# Patient Record
Sex: Female | Born: 1950 | Race: White | Hispanic: Refuse to answer | Marital: Married | Smoking: Former smoker
Health system: Southern US, Community
[De-identification: ages and names within clinical notes are randomized; demographics above are authoritative.]

## PROBLEM LIST (undated history)

## (undated) DIAGNOSIS — J301 Allergic rhinitis due to pollen: Secondary | ICD-10-CM

## (undated) DIAGNOSIS — F419 Anxiety disorder, unspecified: Secondary | ICD-10-CM

## (undated) DIAGNOSIS — I447 Left bundle-branch block, unspecified: Secondary | ICD-10-CM

## (undated) DIAGNOSIS — E039 Hypothyroidism, unspecified: Secondary | ICD-10-CM

## (undated) DIAGNOSIS — E7800 Pure hypercholesterolemia, unspecified: Secondary | ICD-10-CM

## (undated) DIAGNOSIS — J45909 Unspecified asthma, uncomplicated: Secondary | ICD-10-CM

## (undated) DIAGNOSIS — M1990 Unspecified osteoarthritis, unspecified site: Secondary | ICD-10-CM

## (undated) DIAGNOSIS — R310 Gross hematuria: Secondary | ICD-10-CM

## (undated) DIAGNOSIS — L409 Psoriasis, unspecified: Secondary | ICD-10-CM

## (undated) HISTORY — DX: Anxiety disorder, unspecified: F41.9

## (undated) HISTORY — DX: Hypothyroidism, unspecified: E03.9

## (undated) HISTORY — DX: Left bundle-branch block, unspecified: I44.7

## (undated) HISTORY — DX: Gross hematuria: R31.0

## (undated) HISTORY — DX: Allergic rhinitis due to pollen: J30.1

## (undated) HISTORY — DX: Pure hypercholesterolemia, unspecified: E78.00

## (undated) HISTORY — DX: Unspecified osteoarthritis, unspecified site: M19.90

## (undated) HISTORY — DX: Psoriasis, unspecified: L40.9

## (undated) HISTORY — DX: Unspecified asthma, uncomplicated: J45.909

---

## 2008-09-22 ENCOUNTER — Encounter: Payer: Self-pay | Admitting: Family Medicine

## 2008-09-26 ENCOUNTER — Encounter: Payer: Self-pay | Admitting: Family Medicine

## 2009-03-08 ENCOUNTER — Encounter: Payer: Self-pay | Admitting: Family Medicine

## 2009-03-08 LAB — CONVERTED CEMR LAB: TSH: 6.9 microintl units/mL

## 2009-08-01 ENCOUNTER — Ambulatory Visit: Payer: Self-pay | Admitting: Family Medicine

## 2009-08-01 DIAGNOSIS — E785 Hyperlipidemia, unspecified: Secondary | ICD-10-CM

## 2009-08-01 DIAGNOSIS — I10 Essential (primary) hypertension: Secondary | ICD-10-CM

## 2009-08-01 DIAGNOSIS — L538 Other specified erythematous conditions: Secondary | ICD-10-CM

## 2009-08-01 DIAGNOSIS — E039 Hypothyroidism, unspecified: Secondary | ICD-10-CM | POA: Insufficient documentation

## 2009-08-03 LAB — CONVERTED CEMR LAB
ALT: 22 units/L (ref 0–35)
Albumin: 4.4 g/dL (ref 3.5–5.2)
CO2: 22 meq/L (ref 19–32)
CRP, High Sensitivity: 15.3 — ABNORMAL HIGH
Chloride: 106 meq/L (ref 96–112)
GGT: 24 units/L (ref 7–51)
HDL: 45 mg/dL (ref >39)
LDL Cholesterol: 125 mg/dL — ABNORMAL HIGH (ref 0–99)
Platelets: 300 10*3/uL (ref 150–400)
Potassium: 4.5 meq/L (ref 3.5–5.3)
Sodium: 141 meq/L (ref 135–145)
Total CHOL/HDL Ratio: 4.6
Total Protein: 7.5 g/dL (ref 6.0–8.3)
Triglycerides: 175 mg/dL — ABNORMAL HIGH (ref <150)
VLDL: 35 mg/dL (ref 0–40)
WBC: 9.2 10*3/uL (ref 4.0–10.5)

## 2009-08-24 ENCOUNTER — Encounter: Payer: Self-pay | Admitting: Family Medicine

## 2009-08-24 DIAGNOSIS — M26629 Arthralgia of temporomandibular joint, unspecified side: Secondary | ICD-10-CM

## 2009-08-24 DIAGNOSIS — E663 Overweight: Secondary | ICD-10-CM | POA: Insufficient documentation

## 2009-08-24 DIAGNOSIS — J45909 Unspecified asthma, uncomplicated: Secondary | ICD-10-CM | POA: Insufficient documentation

## 2009-09-11 ENCOUNTER — Telehealth: Payer: Self-pay | Admitting: Family Medicine

## 2009-09-17 ENCOUNTER — Encounter: Payer: Self-pay | Admitting: Family Medicine

## 2009-09-21 ENCOUNTER — Other Ambulatory Visit: Admission: RE | Admit: 2009-09-21 | Discharge: 2009-09-21 | Payer: Self-pay | Admitting: Family Medicine

## 2009-09-21 ENCOUNTER — Ambulatory Visit: Payer: Self-pay | Admitting: Family Medicine

## 2009-09-21 DIAGNOSIS — Z780 Asymptomatic menopausal state: Secondary | ICD-10-CM | POA: Insufficient documentation

## 2009-09-21 LAB — CONVERTED CEMR LAB
Glucose, Urine, Semiquant: NEGATIVE
Ketones, urine, test strip: NEGATIVE
Specific Gravity, Urine: 1.025
WBC Urine, dipstick: NEGATIVE
pH: 5.5

## 2009-09-27 ENCOUNTER — Telehealth: Payer: Self-pay | Admitting: Family Medicine

## 2009-09-30 ENCOUNTER — Encounter: Payer: Self-pay | Admitting: Family Medicine

## 2009-10-02 LAB — CONVERTED CEMR LAB
CRP: 2 mg/dL — ABNORMAL HIGH (ref <0.6)
TSH: 0.037 microintl units/mL — ABNORMAL LOW (ref 0.350–4.500)

## 2009-10-03 ENCOUNTER — Encounter: Payer: Self-pay | Admitting: Family Medicine

## 2009-10-03 ENCOUNTER — Encounter: Admission: RE | Admit: 2009-10-03 | Discharge: 2009-10-03 | Payer: Self-pay | Admitting: Family Medicine

## 2009-11-16 ENCOUNTER — Ambulatory Visit: Payer: Self-pay | Admitting: Family Medicine

## 2009-11-16 DIAGNOSIS — R319 Hematuria, unspecified: Secondary | ICD-10-CM

## 2009-11-17 LAB — CONVERTED CEMR LAB
ALT: 16 units/L (ref 0–35)
Albumin: 4.4 g/dL (ref 3.5–5.2)
Bilirubin Urine: NEGATIVE
CO2: 25 meq/L (ref 19–32)
CRP: 1.5 mg/dL — ABNORMAL HIGH (ref <0.6)
Cholesterol: 214 mg/dL — ABNORMAL HIGH (ref 0–200)
Ketones, ur: NEGATIVE mg/dL
LDL Cholesterol: 129 mg/dL — ABNORMAL HIGH (ref 0–99)
Nitrite: NEGATIVE
Potassium: 4.6 meq/L (ref 3.5–5.3)
Sodium: 138 meq/L (ref 135–145)
Total Bilirubin: 0.4 mg/dL (ref 0.3–1.2)
Total Protein: 7.4 g/dL (ref 6.0–8.3)
Urobilinogen, UA: 0.2 (ref 0.0–1.0)
VLDL: 38 mg/dL (ref 0–40)
pH: 5.5 (ref 5.0–8.0)

## 2009-12-13 ENCOUNTER — Telehealth: Payer: Self-pay | Admitting: Family Medicine

## 2009-12-18 ENCOUNTER — Telehealth: Payer: Self-pay | Admitting: Family Medicine

## 2010-01-15 ENCOUNTER — Encounter: Payer: Self-pay | Admitting: Family Medicine

## 2010-07-03 ENCOUNTER — Encounter: Payer: Self-pay | Admitting: Family Medicine

## 2010-10-16 NOTE — Progress Notes (Signed)
Summary: clarify dose  Phone Note Outgoing Call   Summary of Call: Pls confirm with pt her dose of armour thyroid.  She has listed 30 mg (4 tabs daily).  Armour comes in GRAINS not MGs and her insurance will not accept this dosage.  Thanks.   Initial call taken by: Seymour Bars DO,  December 18, 2009 3:40 PM  Follow-up for Phone Call        [facility] for Pt to [facility] w/ correct dosing Follow-up by: Payton Spark CMA,  December 18, 2009 4:20 PM  Additional Follow-up for Phone Call Additional follow up Details #1::        Pt states she is taking 30mg  tabs 4 daily for a total of 120mg  daily. Additional Follow-up by: Payton Spark CMA,  December 19, 2009 11:24 AM    Additional Follow-up for Phone Call Additional follow up Details #2::    Called [place] at [facility]. Armour thyroid 120mg  = 2 grain  Follow-up by: Payton Spark CMA,  December 19, 2009 12:02 PM  New/Updated Medications: ARMOUR THYROID 120 MG TABS (THYROID) 1 tab by mouth once a day Prescriptions: ARMOUR THYROID 120 MG TABS (THYROID) 1 tab by mouth once a day  #90 x 0   Entered and Authorized by:   Seymour Bars DO   Signed by:   Seymour Bars DO on 12/19/2009   Method used:   Printed then faxed to ...       [facility] [address] (retail)       [address]       [place], [place]  [address]       Ph: [phone]       Fax: [phone]   RxID:   [id number]  changed her from 4 (30 mg) tabs to 120 mg 1 tab daily = dose b/c insurance would not cover large quantity.  Seymour Bars, D.O.  Appended Document: clarify dose Pt aware

## 2010-10-16 NOTE — Progress Notes (Signed)
Summary: New insurance needs 90 day supply  Phone Note Refill Request   Refills Requested: Medication #1:  XANAX 0.5 MG TABS 1 tab by mouth two times a day as needed anxiety  Medication #2:  ARMOUR THYROID 30 MG TABS 4 tabs by mouth once daily Pt changed insurance coverage and needs 90 days supply of these meds sent to [facility].   Initial call taken by: Payton Spark CMA,  December 13, 2009 1:54 PM    Prescriptions: ARMOUR THYROID 30 MG TABS (THYROID) 4 tabs by mouth once daily  #360 x 0   Entered and Authorized by:   Seymour Bars DO   Signed by:   Seymour Bars DO on 12/13/2009   Method used:   Printed then faxed to ...       [facility] (mail-order)             ,          Ph: [phone]       Fax: [phone]   RxID:   [id number] ARMOUR THYROID 30 MG TABS (THYROID) 4 tabs by mouth once daily  #90 x 0   Entered and Authorized by:   Seymour Bars DO   Signed by:   Seymour Bars DO on 12/13/2009   Method used:   Printed then faxed to ...       [facility]* (mail-order)             ,          Ph: [phone]       Fax: [phone]   RxID:   [id number] XANAX 0.5 MG TABS (ALPRAZOLAM) 1 tab by mouth two times a day as needed anxiety  #180 x 0   Entered and Authorized by:   Seymour Bars DO   Signed by:   Seymour Bars DO on 12/13/2009   Method used:   Printed then faxed to ...       [facility]* (mail-order)             ,          Ph: [phone]       Fax: [phone]   RxID:   [id number]   Appended Document: New insurance needs 90 day supply faxed to [facility]

## 2010-10-16 NOTE — Letter (Signed)
[facility]   Imported By: Sherian Rein 07/17/2010 10:28:48  _____________________________________________________________________  External Attachment:    Type:   Image     Comment:   External Document

## 2010-10-16 NOTE — Assessment & Plan Note (Signed)
Summary: CPE with pap   Vital Signs:  Patient profile:   60 year old female Menstrual status:  postmenopausal Height:      67 inches Weight:      209 pounds BMI:     32.85 O2 Sat:      98 % on Room air Temp:     98.5 degrees F oral Pulse rate:   106 / minute BP sitting:   137 / 85  (left arm) Cuff size:   large  Vitals Entered By: Payton Spark CMA (September 21, 2009 4:11 PM)  O2 Flow:  Room air CC: CPE w/pap     Menstrual Status postmenopausal   Primary Care Provider:  Seymour Bars DO  CC:  CPE w/pap.  History of Present Illness: 60 yo WF presents for CPE with pap smear.  She is due for her pap smear, mammogram and DEXA scan.  She would like to lost 30 lbs.  She wants to get back to dancing.  She eats very healthy and takes supplements.  She has a sedentary job.  She is married.  Her has been postmenopausal x 5 yrs.  Denies any bleeding but has some vaginal irritation.  Denies problems voiding.  Has declined colon cancer screening.  Labs and tetanus are UTD.    Current Medications (verified): 1)  Xanax 0.5 Mg Tabs (Alprazolam) .Marland Kitchen.. 1 Tab By Mouth Two Times A Day As Needed Anxiety 2)  Armour Thyroid 30 Mg Tabs (Thyroid) .... Take As Directed 3)  Doxepin Hcl 25 Mg Caps (Doxepin Hcl) .... Take 2 Tabs By Mouth Daily 4)  Xenical 120 Mg Caps (Orlistat) .Marland Kitchen.. 1 Capsule By Mouth Three Times A Day Give During or < 1 Hr After Meals Containing Fat  Allergies (verified): 1)  ! Penicillin 2)  Jonne Ply  Past History:  Past Medical History: Reviewed history from 09/17/2009 and no changes required. menopause at 54. Hypothyroidism since 1990; Hashimoto's Obesity Celiac Dz AR asthma anxiety High chol (not on meds)  Past Surgical History: Reviewed history from 08/01/2009 and no changes required. Tonsillectomy  Family History: Reviewed history from 08/01/2009 and no changes required. father died from ETOHism mother alive, colon cancer at 53, high chol, HTN, DM 2 sisters and 1  brother healthy  Social History: Reviewed history from 08/01/2009 and no changes required. 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 Has a daughter, 78, lives with them. Quit smoking in 1985. Denies ETOH. Walks 3 x a wk. Goal wt 170.  Review of Systems       The patient complains of weight gain.  The patient denies anorexia, fever, weight loss, vision loss, decreased hearing, hoarseness, chest pain, syncope, dyspnea on exertion, peripheral edema, prolonged cough, headaches, hemoptysis, abdominal pain, melena, hematochezia, severe indigestion/heartburn, hematuria, incontinence, genital sores, muscle weakness, suspicious skin lesions, transient blindness, difficulty walking, depression, unusual weight change, abnormal bleeding, enlarged lymph nodes, angioedema, breast masses, and testicular masses.    Physical Exam  General:  alert, well-developed, well-nourished, and well-hydrated.   obese Head:  normocephalic and atraumatic.   Eyes:  pupils equal, pupils round, and pupils reactive to light.  wears glasses Ears:  no external deformities.   Nose:  no nasal discharge.   Mouth:  pharynx pink and moist and fair dentition.   Neck:  no masses.  no audible carotid bruits Breasts:  No mass, nodules, thickening, tenderness, bulging, retraction, inflamation, nipple discharge or skin changes noted.   Lungs:  Normal respiratory effort, chest expands symmetrically.  Lungs are clear to auscultation, no crackles or wheezes. Heart:  Normal rate and regular rhythm. S1 and S2 normal without gallop, murmur, click, rub or other extra sounds. Abdomen:  Bowel sounds positive,abdomen soft and non-tender without masses, organomegaly; no AA bruits Genitalia:  normal introitus, no external lesions, no vaginal discharge, mucosa pink and moist, no vaginal or cervical lesions, no friaility or hemorrhage, and vaginal atrophy.  grade I uterine prolapse into the vagina Msk:  no joint swelling.   Pulses:  2+ radial and  pedal pulses Extremities:  trace bilat ankle edema Skin:  color normal and no suspicious lesions.   Cervical Nodes:  No lymphadenopathy noted Psych:  good eye contact, not anxious appearing, and not depressed appearing.     Impression & Recommendations:  Problem # 1:  Gynecological examination-routine (ICD-V72.31) Keeping healthy checklist for women reviewed. BP in pre-HTN range.  BMI 32 c/w class I obesity. Labs updated in November showing fasting glucose of 100, dyslipidemia with a high CRP.  We reduced her dose of thyroid medicaiton. Recommend daily MVI along with Calcium/ D daily. Mammogram and DEXA scheduled. Declined colonoscopy, understands risk of not proceeding with this. Immunizations are UTD. Work on [employer], regular exercise, wt loss. Grade I uterine prolapse, asymptomatic.  Consider referral to gyn for this.  Plan to repeat UA at f/u visit for trace blood present.  Problem # 2:  DYSLIPIDEMIA (ICD-272.4)  Iniitate statin for dyslipidemia with a high CRP.  She will start by cutting Crestor tabs in half.  Will work on lifestyle changes.  Check FLP/ CRP/ LFTS in 3 mos. Her updated medication list for this problem includes:    Crestor 10 Mg Tabs (Rosuvastatin calcium) .Marland Kitchen... 1 tab by mouth qhs  Labs Reviewed: SGOT: 18 (08/01/2009)   SGPT: 22 (08/01/2009)   HDL:45 (08/01/2009)  LDL:125 (08/01/2009)  Chol:205 (08/01/2009)  Trig:175 (08/01/2009)  Problem # 3:  UNSPECIFIED HYPOTHYROIDISM (ICD-244.9)  Needs repeat TSH today after decreasing dose 8 wks ago. Her updated medication list for this problem includes:    Armour Thyroid 30 Mg Tabs (Thyroid) .Marland Kitchen... Take as directed  Orders: T-TSH ([id number])  Complete Medication List: 1)  Xanax 0.5 Mg Tabs (Alprazolam) .Marland Kitchen.. 1 tab by mouth two times a day as needed anxiety 2)  Armour Thyroid 30 Mg Tabs (Thyroid) .... Take as directed 3)  Doxepin Hcl 25 Mg Caps (Doxepin hcl) .... Take 2 tabs by mouth daily 4)  Crestor 10 Mg  Tabs (Rosuvastatin calcium) .Marland Kitchen.. 1 tab by mouth qhs  Other Orders: T-Mammography Bilateral Screening (32440) T-DXA Bone Density/ Appendicular (10272) T-Dual DXA Bone Density/ Axial (53664) UA Dipstick w/o Micro (automated)  (81003)  Patient Instructions: 1)  Start Crestor -- cut 10 mg tabs in half and take at bedtime each night. 2)  Recheck TSH today. 3)  Will call you w/ results tomorrow. 4)  Schedule your mammogram and bone DEXA scan downstairs. 5)  Work on [employer], 1 hr of exercise 4-5 days/ wk. 6)  Return in 8 wks to check LFTs, fasting sugar and recheck urine.   Prescriptions: CRESTOR 10 MG TABS (ROSUVASTATIN CALCIUM) 1 tab by mouth qhs  #90 x 0   Entered and Authorized by:   Seymour Bars DO   Signed by:   Seymour Bars DO on 09/21/2009   Method used:   Print then Give to Patient   RxID:   [id number]   Laboratory Results   Urine Tests    Routine Urinalysis  Color: yellow Appearance: Clear Glucose: negative   (Normal Range: Negative) Bilirubin: negative   (Normal Range: Negative) Ketone: negative   (Normal Range: Negative) Spec. Gravity: 1.025   (Normal Range: 1.003-1.035) Blood: trace-intact   (Normal Range: Negative) pH: 5.5   (Normal Range: 5.0-8.0) Protein: negative   (Normal Range: Negative) Urobilinogen: 0.2   (Normal Range: 0-1) Nitrite: negative   (Normal Range: Negative) Leukocyte Esterace: negative   (Normal Range: Negative)

## 2010-10-16 NOTE — Progress Notes (Signed)
Summary: Lab order CRP  Phone Note Call from Patient   Caller: Patient Summary of Call: Pt would like to add a CRP to lab order. Please advise.  Initial call taken by: Payton Spark CMA,  September 27, 2009 1:40 PM  Follow-up for Phone Call        what lab orders?  Her CRP was high in Nov. It is too early to recheck this. Follow-up by: Seymour Bars DO,  September 27, 2009 1:42 PM     Appended Document: Lab order CRP [facility] informing Pt  Appended Document: Lab order CRP

## 2010-10-16 NOTE — Letter (Signed)
Summary: [facility]   Imported By: Lanelle Bal 01/31/2010 13:51:38  _____________________________________________________________________  External Attachment:    Type:   Image     Comment:   External Document

## 2010-10-16 NOTE — Assessment & Plan Note (Signed)
Summary: labs only   Allergies: 1)  ! Penicillin 2)  Asa   Complete Medication List: 1)  Xanax 0.5 Mg Tabs (Alprazolam) .Marland Kitchen.. 1 tab by mouth two times a day as needed anxiety 2)  Armour Thyroid 30 Mg Tabs (Thyroid) .... 4 tabs by mouth once daily 3)  Doxepin Hcl 25 Mg Caps (Doxepin hcl) .... Take 2 tabs by mouth daily 4)  Crestor 10 Mg Tabs (Rosuvastatin calcium) .Marland Kitchen.. 1 tab by mouth qhs  Other Orders: T-Urinalysis ([id number]) T-Comprehensive Metabolic Panel [phone]) T-Lipid Profile [phone]) T-TSH [id number]) T-CRP (C-Reactive Protein) (28413)

## 2010-10-16 NOTE — Miscellaneous (Signed)
Summary: old records  Clinical Lists Changes  Observations: Added new observation of PAST MED HX: menopause at 88. Hypothyroidism since 1990; Hashimoto's Obesity Celiac Dz AR asthma anxiety High chol (not on meds)  (09/17/2009 14:13) Added new observation of PRIMARY MD: Seymour Bars DO (09/17/2009 14:13)       Past History:  Past Medical History: menopause at 69. Hypothyroidism since 1990; Hashimoto's Obesity Celiac Dz AR asthma anxiety High chol (not on meds)

## 2011-08-14 IMAGING — OT DG DXA BONE DENSITY STUDY HL7
1 series · 1 of 1 positions shown · non-contrast
Comparison: None.

CLINICAL DATA: 58-year-old postmenopausal female with history of
hypothyroidism.  The patient takes calcium and vitamin D.

[Series 2: — · 1 of 1 slices shown]
[im 1/1]
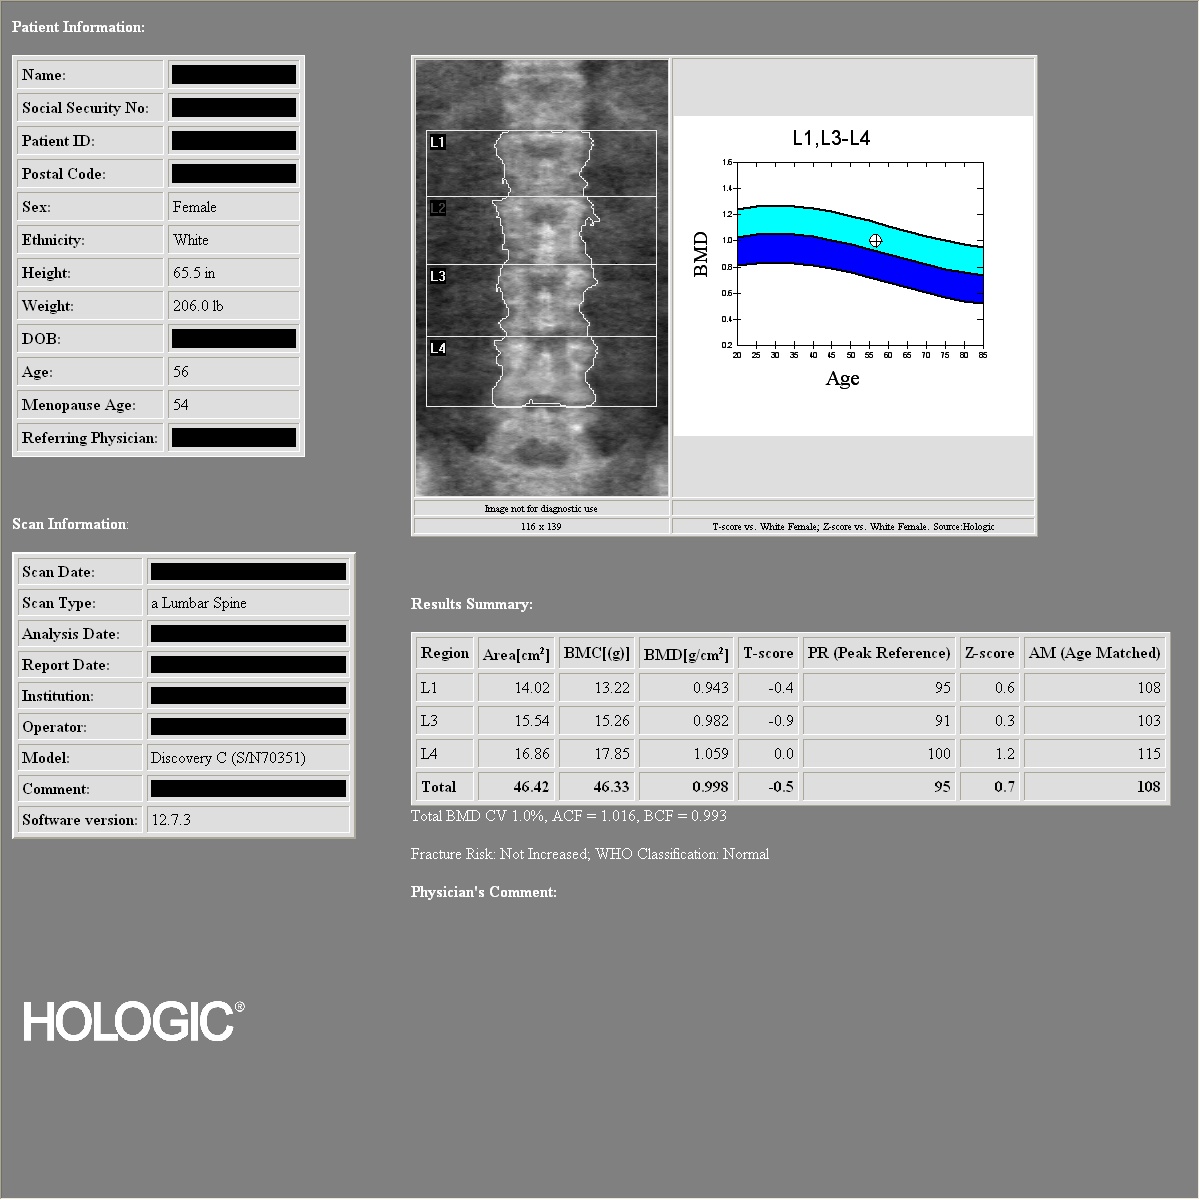

[1 of 1 positions shown; findings below may reference images not displayed]

DUAL X-RAY ABSORPTIOMETRY (DXA) FOR BONE MINERAL DENSITY

AP LUMBAR SPINE (L1, L3, L4)

Bone Mineral Density (BMD):            0.998 g/cm2
Young Adult T Score:                          -0.5
Z Score:

THE LEFT FEMUR (NECK)

Bone Mineral Density (BMD):             0.743 g/cm2
Young Adult T Score:                           -1.0
Z Score:

ASSESSMENT:  Patient's diagnostic category is NORMAL by WHO
Criteria.

FRACTURE RISK: NOT INCREASED

FRAX: Not applicable

L2 is excluded due to a greater than one standard deviation
difference in T-score from the adjacent vertebral body.
Please note that data from different machines is
not comparable.

RECOMMENDATIONS:

Effective therapies are available in the form of bisphosphonates,
selective estrogen receptor modulators, biologic agents, and
hormone replacement therapy (for women).  All patients should
ensure an adequate intake of dietary calcium (1200mg daily) and
vitamin D (800 Jesse Obeso) unless contraindicated.

All treatment decisions require clinical judgement and
consideration of individual patient factors, including patient
preferences, co-morbidities, previous drug use, risk factors not
captured in the FRAX model (e.g., frailty, falls, vitamin D
deficiency, increased bone turnover, interval significant decline
in bone density) and possible under-or over-estimation of fracture
risk by FRAX.

The National Osteoporosis Foundation recommends that FDA-approved
medical therapies be considered in postmenopausal women and mean
age 50 or older with a:

      1)     Hip or vertebral (clinical or morphometric) fracture.

2)    T-score of -2.5 or lower at the spine or hip.
3)    Ten-year fracture probability by FRAX of 3% or greater for
hip fracture or 20% or greater for major osteoporotic fracture.
FOLLOW-UP:

People with diagnosed cases of osteoporosis or at high risk for
fracture should have regular bone mineral density tests.  For
patients eligible for Medicare, routine testing is allowed once
every 2 years.  The testing frequency can be increased to one year
for patients who have rapidly progressing disease, those who are
receiving or discontinuing medical therapy to restore bone mass, or
have additional risk factors.

World Health Organization (WHO) Criteria:

Normal: T scores from +1.0 to -1.0
Low Bone Mass (Osteopenia): T scores between -1.0 and -2.5
Osteoporosis: T scores -2.5 and below

Comparison to Reference Population:

T score is the key measure used in the diagnosis of osteoporosis
and relative risk determination for fracture.  It provides a value
for bone mass relative to the mean bone mass of a young adult
reference population expressed in terms of standard deviation (SD).

Z score is the age-matched score showing the patient's values
compared to a population matched for age, sex, and race.  This is
also expressed in terms of standard deviation.  The patient may
have values that compare favorably to the age-matched values and
still be at increased risk for fracture.

## 2013-06-24 ENCOUNTER — Other Ambulatory Visit (HOSPITAL_BASED_OUTPATIENT_CLINIC_OR_DEPARTMENT_OTHER): Payer: Self-pay | Admitting: Family Medicine

## 2013-06-24 DIAGNOSIS — Z1231 Encounter for screening mammogram for malignant neoplasm of breast: Secondary | ICD-10-CM

## 2013-07-27 ENCOUNTER — Other Ambulatory Visit: Payer: Self-pay | Admitting: Family Medicine

## 2013-07-27 DIAGNOSIS — Z1231 Encounter for screening mammogram for malignant neoplasm of breast: Secondary | ICD-10-CM

## 2013-08-19 ENCOUNTER — Ambulatory Visit (INDEPENDENT_AMBULATORY_CARE_PROVIDER_SITE_OTHER): Payer: 59

## 2013-08-19 ENCOUNTER — Ambulatory Visit: Payer: Self-pay

## 2013-08-19 DIAGNOSIS — Z1231 Encounter for screening mammogram for malignant neoplasm of breast: Secondary | ICD-10-CM

## 2013-08-20 ENCOUNTER — Ambulatory Visit (HOSPITAL_BASED_OUTPATIENT_CLINIC_OR_DEPARTMENT_OTHER): Payer: Self-pay

## 2017-07-29 ENCOUNTER — Other Ambulatory Visit: Payer: Self-pay | Admitting: Family Medicine

## 2017-07-29 DIAGNOSIS — Z1231 Encounter for screening mammogram for malignant neoplasm of breast: Secondary | ICD-10-CM

## 2019-04-22 DIAGNOSIS — B372 Candidiasis of skin and nail: Secondary | ICD-10-CM | POA: Diagnosis not present

## 2019-04-22 DIAGNOSIS — L408 Other psoriasis: Secondary | ICD-10-CM | POA: Diagnosis not present

## 2019-06-01 DIAGNOSIS — Z23 Encounter for immunization: Secondary | ICD-10-CM | POA: Diagnosis not present

## 2019-07-22 DIAGNOSIS — L408 Other psoriasis: Secondary | ICD-10-CM | POA: Diagnosis not present

## 2019-07-22 DIAGNOSIS — B372 Candidiasis of skin and nail: Secondary | ICD-10-CM | POA: Diagnosis not present

## 2020-01-20 DIAGNOSIS — Z79899 Other long term (current) drug therapy: Secondary | ICD-10-CM | POA: Diagnosis not present

## 2020-01-20 DIAGNOSIS — L408 Other psoriasis: Secondary | ICD-10-CM | POA: Diagnosis not present

## 2020-01-20 DIAGNOSIS — B372 Candidiasis of skin and nail: Secondary | ICD-10-CM | POA: Diagnosis not present

## 2020-03-24 DIAGNOSIS — L304 Erythema intertrigo: Secondary | ICD-10-CM | POA: Diagnosis not present

## 2020-03-24 DIAGNOSIS — L409 Psoriasis, unspecified: Secondary | ICD-10-CM | POA: Diagnosis not present

## 2020-09-16 HISTORY — PX: CARDIAC CATHETERIZATION: SHX172

## 2021-05-24 DIAGNOSIS — H43393 Other vitreous opacities, bilateral: Secondary | ICD-10-CM | POA: Diagnosis not present

## 2021-05-24 DIAGNOSIS — H02834 Dermatochalasis of left upper eyelid: Secondary | ICD-10-CM | POA: Diagnosis not present

## 2021-05-24 DIAGNOSIS — H26493 Other secondary cataract, bilateral: Secondary | ICD-10-CM | POA: Diagnosis not present

## 2021-05-24 DIAGNOSIS — H02831 Dermatochalasis of right upper eyelid: Secondary | ICD-10-CM | POA: Diagnosis not present

## 2021-05-24 DIAGNOSIS — H527 Unspecified disorder of refraction: Secondary | ICD-10-CM | POA: Diagnosis not present

## 2021-05-24 DIAGNOSIS — H0100A Unspecified blepharitis right eye, upper and lower eyelids: Secondary | ICD-10-CM | POA: Diagnosis not present

## 2021-05-24 DIAGNOSIS — H0100B Unspecified blepharitis left eye, upper and lower eyelids: Secondary | ICD-10-CM | POA: Diagnosis not present

## 2021-05-24 DIAGNOSIS — H43813 Vitreous degeneration, bilateral: Secondary | ICD-10-CM | POA: Diagnosis not present

## 2021-05-24 DIAGNOSIS — H40003 Preglaucoma, unspecified, bilateral: Secondary | ICD-10-CM | POA: Diagnosis not present

## 2021-05-24 DIAGNOSIS — Z961 Presence of intraocular lens: Secondary | ICD-10-CM | POA: Diagnosis not present

## 2021-05-24 DIAGNOSIS — H57813 Brow ptosis, bilateral: Secondary | ICD-10-CM | POA: Diagnosis not present

## 2021-05-29 ENCOUNTER — Telehealth: Payer: Self-pay

## 2021-05-29 DIAGNOSIS — U071 COVID-19: Secondary | ICD-10-CM | POA: Diagnosis not present

## 2021-05-29 NOTE — Telephone Encounter (Signed)
I don't know who told her that but unfortunately I can't do that b/c she is not a patient of mine.

## 2021-05-29 NOTE — Telephone Encounter (Signed)
Patient is calling in stating her husband Phyllicia Sze was prescribed the anti-viral medication for COVID and was told if she were to test positive then Dr.McGowen would send in the prescription for her. Patient is not currently a patient within in [place], okay to send prescription in?

## 2021-05-29 NOTE — Telephone Encounter (Signed)
Pt was advised medication could not be sent since Dr.McGowen is not her PCP. Advised to f/u with PCP, she mentioned she did not have one. She wanted to know if she could establish care here since husband is already patient. She was made aware this would have to be approved first.   Please review and advise

## 2021-05-29 NOTE — Telephone Encounter (Signed)
Please advise 

## 2021-05-30 NOTE — Telephone Encounter (Signed)
Attempted to contact patient to schedule new patient appt with Dr. Anitra Lauth on 10/5.  Home number listed is not a valid number; left message on cell [phone].

## 2021-05-30 NOTE — Telephone Encounter (Signed)
Yes, okay.

## 2021-05-30 NOTE — Telephone Encounter (Signed)
Please assist patient with scheduling for new patient appt, thanks.

## 2021-05-30 NOTE — Telephone Encounter (Signed)
Patient called back.  She did a tele health visit thru medicare.  She will call back later once she has recovered from COVID to set up an appt with Dr. Anitra Lauth.

## 2021-06-29 DIAGNOSIS — H26492 Other secondary cataract, left eye: Secondary | ICD-10-CM | POA: Diagnosis not present

## 2021-09-16 HISTORY — PX: DOBUTAMINE STRESS ECHO: SHX5426

## 2021-10-23 DIAGNOSIS — L304 Erythema intertrigo: Secondary | ICD-10-CM | POA: Diagnosis not present

## 2021-10-23 DIAGNOSIS — L409 Psoriasis, unspecified: Secondary | ICD-10-CM | POA: Diagnosis not present

## 2021-12-11 DIAGNOSIS — H02831 Dermatochalasis of right upper eyelid: Secondary | ICD-10-CM | POA: Diagnosis not present

## 2021-12-11 DIAGNOSIS — H02413 Mechanical ptosis of bilateral eyelids: Secondary | ICD-10-CM | POA: Diagnosis not present

## 2021-12-11 DIAGNOSIS — H02423 Myogenic ptosis of bilateral eyelids: Secondary | ICD-10-CM | POA: Diagnosis not present

## 2021-12-11 DIAGNOSIS — H0279 Other degenerative disorders of eyelid and periocular area: Secondary | ICD-10-CM | POA: Diagnosis not present

## 2021-12-11 DIAGNOSIS — H02834 Dermatochalasis of left upper eyelid: Secondary | ICD-10-CM | POA: Diagnosis not present

## 2021-12-15 HISTORY — PX: LEG / ANKLE SOFT TISSUE BIOPSY: SUR148

## 2021-12-26 DIAGNOSIS — R0602 Shortness of breath: Secondary | ICD-10-CM | POA: Diagnosis not present

## 2021-12-26 DIAGNOSIS — R0789 Other chest pain: Secondary | ICD-10-CM | POA: Diagnosis not present

## 2021-12-28 DIAGNOSIS — L409 Psoriasis, unspecified: Secondary | ICD-10-CM | POA: Diagnosis not present

## 2021-12-28 DIAGNOSIS — L259 Unspecified contact dermatitis, unspecified cause: Secondary | ICD-10-CM | POA: Diagnosis not present

## 2022-01-17 NOTE — Progress Notes (Deleted)
    Referring-Karen Bowen, DO Reason for referral-chest pain  HPI: 71 year old female for evaluation of chest pain at request of Loyal Gambler, DO.  Previously seen by Joni Fears, MD at [facility].  No current outpatient medications on file.   No current facility-administered medications for this visit.    Allergies  Allergen Reactions   Aspirin     REACTION: GI intol   Penicillins     No past medical history on file.  *** The histories are not reviewed yet. Please review them in the "History" navigator section and refresh this [place].  Social History   Socioeconomic History   Marital status: Married    Spouse name: Not on file   Number of children: Not on file   Years of education: Not on file   Highest education level: Not on file  Occupational History   Not on file  Tobacco Use   Smoking status: Not on file   Smokeless tobacco: Not on file  Substance and Sexual Activity   Alcohol use: Not on file   Drug use: Not on file   Sexual activity: Not on file  Other Topics Concern   Not on file  Social History Narrative   Not on file   Social Determinants of Health   Financial Resource Strain: Not on file  Food Insecurity: Not on file  Transportation Needs: Not on file  Physical Activity: Not on file  Stress: Not on file  Social Connections: Not on file  Intimate Partner Violence: Not on file    No family history on file.  ROS: no fevers or chills, productive cough, hemoptysis, dysphasia, odynophagia, melena, hematochezia, dysuria, hematuria, rash, seizure activity, orthopnea, PND, pedal edema, claudication. Remaining systems are negative.  Physical Exam:   There were no vitals taken for this visit.  General:  Well developed/well nourished in NAD Skin warm/dry Patient not depressed No peripheral clubbing Back-normal HEENT-normal/normal eyelids Neck supple/normal carotid upstroke bilaterally; no bruits; no JVD; no thyromegaly chest - CTA/ normal  expansion CV - RRR/normal S1 and S2; no murmurs, rubs or gallops;  PMI nondisplaced Abdomen -NT/ND, no HSM, no mass, + bowel sounds, no bruit 2+ femoral pulses, no bruits Ext-no edema, chords, 2+ DP Neuro-grossly nonfocal  ECG - personally reviewed  A/P  1 chest pain-  2 hyperlipidemia-  Kirk Ruths, MD

## 2022-01-22 DIAGNOSIS — L409 Psoriasis, unspecified: Secondary | ICD-10-CM | POA: Diagnosis not present

## 2022-01-22 DIAGNOSIS — C44729 Squamous cell carcinoma of skin of left lower limb, including hip: Secondary | ICD-10-CM | POA: Diagnosis not present

## 2022-01-22 DIAGNOSIS — L304 Erythema intertrigo: Secondary | ICD-10-CM | POA: Diagnosis not present

## 2022-01-22 DIAGNOSIS — L259 Unspecified contact dermatitis, unspecified cause: Secondary | ICD-10-CM | POA: Diagnosis not present

## 2022-01-28 DIAGNOSIS — R0602 Shortness of breath: Secondary | ICD-10-CM | POA: Diagnosis not present

## 2022-01-28 DIAGNOSIS — I447 Left bundle-branch block, unspecified: Secondary | ICD-10-CM | POA: Diagnosis not present

## 2022-01-28 DIAGNOSIS — R0789 Other chest pain: Secondary | ICD-10-CM | POA: Diagnosis not present

## 2022-01-28 DIAGNOSIS — R9439 Abnormal result of other cardiovascular function study: Secondary | ICD-10-CM | POA: Diagnosis not present

## 2022-01-28 DIAGNOSIS — R0600 Dyspnea, unspecified: Secondary | ICD-10-CM | POA: Diagnosis not present

## 2022-01-30 ENCOUNTER — Ambulatory Visit: Payer: Self-pay | Admitting: Cardiology

## 2022-02-08 DIAGNOSIS — I447 Left bundle-branch block, unspecified: Secondary | ICD-10-CM | POA: Diagnosis not present

## 2022-02-08 DIAGNOSIS — Z79899 Other long term (current) drug therapy: Secondary | ICD-10-CM | POA: Diagnosis not present

## 2022-02-08 DIAGNOSIS — R9439 Abnormal result of other cardiovascular function study: Secondary | ICD-10-CM | POA: Diagnosis not present

## 2022-02-08 DIAGNOSIS — R0600 Dyspnea, unspecified: Secondary | ICD-10-CM | POA: Diagnosis not present

## 2022-02-08 DIAGNOSIS — I2510 Atherosclerotic heart disease of native coronary artery without angina pectoris: Secondary | ICD-10-CM | POA: Diagnosis not present

## 2022-02-25 DIAGNOSIS — I447 Left bundle-branch block, unspecified: Secondary | ICD-10-CM | POA: Diagnosis not present

## 2022-02-25 DIAGNOSIS — E785 Hyperlipidemia, unspecified: Secondary | ICD-10-CM | POA: Diagnosis not present

## 2022-04-01 DIAGNOSIS — H53483 Generalized contraction of visual field, bilateral: Secondary | ICD-10-CM | POA: Diagnosis not present

## 2022-05-23 DIAGNOSIS — C44729 Squamous cell carcinoma of skin of left lower limb, including hip: Secondary | ICD-10-CM | POA: Diagnosis not present

## 2022-07-30 DIAGNOSIS — I447 Left bundle-branch block, unspecified: Secondary | ICD-10-CM | POA: Diagnosis not present

## 2022-07-30 DIAGNOSIS — Z87891 Personal history of nicotine dependence: Secondary | ICD-10-CM | POA: Diagnosis not present

## 2022-07-30 DIAGNOSIS — I10 Essential (primary) hypertension: Secondary | ICD-10-CM | POA: Diagnosis not present

## 2022-07-30 DIAGNOSIS — E785 Hyperlipidemia, unspecified: Secondary | ICD-10-CM | POA: Diagnosis not present

## 2022-07-30 DIAGNOSIS — I2510 Atherosclerotic heart disease of native coronary artery without angina pectoris: Secondary | ICD-10-CM | POA: Diagnosis not present

## 2022-08-05 DIAGNOSIS — I447 Left bundle-branch block, unspecified: Secondary | ICD-10-CM | POA: Diagnosis not present

## 2022-08-19 ENCOUNTER — Ambulatory Visit: Payer: Self-pay | Admitting: Family Medicine

## 2022-08-19 ENCOUNTER — Ambulatory Visit (INDEPENDENT_AMBULATORY_CARE_PROVIDER_SITE_OTHER): Payer: Medicare Other | Admitting: Family Medicine

## 2022-08-19 ENCOUNTER — Encounter: Payer: Self-pay | Admitting: Family Medicine

## 2022-08-19 VITALS — BP 130/79 | HR 78 | Temp 97.7°F | Ht 67.0 in | Wt 201.6 lb

## 2022-08-19 DIAGNOSIS — M79644 Pain in right finger(s): Secondary | ICD-10-CM

## 2022-08-19 DIAGNOSIS — M79671 Pain in right foot: Secondary | ICD-10-CM | POA: Diagnosis not present

## 2022-08-19 DIAGNOSIS — E7800 Pure hypercholesterolemia, unspecified: Secondary | ICD-10-CM

## 2022-08-19 DIAGNOSIS — J309 Allergic rhinitis, unspecified: Secondary | ICD-10-CM | POA: Diagnosis not present

## 2022-08-19 DIAGNOSIS — M79645 Pain in left finger(s): Secondary | ICD-10-CM | POA: Diagnosis not present

## 2022-08-19 DIAGNOSIS — E039 Hypothyroidism, unspecified: Secondary | ICD-10-CM

## 2022-08-19 NOTE — Progress Notes (Signed)
Office Note 08/19/2022  CC:  Chief Complaint  Patient presents with   Establish Care    HPI:  Courtney Kelley is a 71 y.o. female who is here to establish care and discuss foot and sinus concerns. Patient's most recent primary MD: Dr. Kristin Bruins, retired. Old records were reviewed prior to or during today's visit.  For 6 or 8 months or so she has felt pain in the PIP of the left hand middle finger and right hand hand long finger.  No redness, primarily feels stiff and not so much painful.  Has trouble closing them all the way.  Mild intensity.    Has some pain along the lateral aspect of the right foot localized at the base of the fifth metatarsal.  No injury, no swelling, no redness.  She has recurrent episodes of nasal congestion and postnasal drip/tickle in throat.  Uses saline nasal spray. History of palpitations when taking nasal decongestant or Sudafed products.  Does not use antihistamine much.  Has Nasacort but does not use it much.  Has hypothyroidism, takes Armour Thyroid because she has palpitations on Synthroid.  Has chronic anxiety for which she takes alprazolam twice daily and doxepin daily--- long-term.   Past Medical History:  Diagnosis Date   Anxiety    Asthma    Hay fever    Hypercholesterolemia    Hypothyroidism    LBBB (left bundle branch block)    Osteoarthritis    Psoriasis     Past Surgical History:  Procedure Laterality Date   CARDIAC CATHETERIZATION  2022   nonosbst cad   DOBUTAMINE STRESS ECHO  2023   +WMA->?ischemia? EF 55%-->f/u cath clean   LEG / ANKLE SOFT TISSUE BIOPSY  12/2021    Family History  Problem Relation Age of Onset   Arthritis Mother    Cancer Mother    Diabetes Mother    Heart disease Mother    High Cholesterol Mother    High blood pressure Mother    Alcohol abuse Father    Cancer Father    COPD Father    Early death Father    Heart attack Father    Depression Sister    Depression Sister    Alcohol abuse Sister     COPD Sister    Diabetes Sister    High Cholesterol Sister    Mental illness Sister    Drug abuse Sister    High Cholesterol Brother    Drug abuse Daughter    Heart disease Maternal Grandmother    Arthritis Maternal Grandmother    COPD Maternal Grandmother    Heart disease Maternal Grandfather    Stroke Paternal Grandmother    Heart attack Paternal Grandfather     Social History   Socioeconomic History   Marital status: Married    Spouse name: Not on file   Number of children: Not on file   Years of education: Not on file   Highest education level: Not on file  Occupational History   Not on file  Tobacco Use   Smoking status: Former    Types: Cigarettes    Quit date: 1984    Years since quitting: 39.9   Smokeless tobacco: Never  Substance and Sexual Activity   Alcohol use: Not on file   Drug use: Never   Sexual activity: Not on file  Other Topics Concern   Not on file  Social History Narrative   Married to [place].   Educ: 1 yr business school  Occup: Admin assistant   No T/A   Social Determinants of Radio broadcast assistant Strain: Not on file  Food Insecurity: Not on file  Transportation Needs: Not on file  Physical Activity: Not on file  Stress: Not on file  Social Connections: Not on file  Intimate Partner Violence: Not on file    Outpatient Encounter Medications as of 08/19/2022  Medication Sig   ALPRAZolam (XANAX) 0.5 MG tablet Take 0.5 mg by mouth 2 (two) times daily as needed.   APPLE CIDER VINEGAR PO Take by mouth daily.   ARMOUR THYROID 60 MG tablet Take 60 mg by mouth 2 (two) times daily.   ascorbic acid (VITAMIN C) 250 MG tablet Take 500 mg by mouth daily.   calcipotriene (DOVONOX) 0.005 % cream Apply topically 2 (two) times daily.   Cholecalciferol 125 MCG (5000 UT) capsule Take 5,000 Units by mouth daily.   clobetasol cream (TEMOVATE) 8.75 % Apply 1 Application topically 2 (two) times daily.   co-enzyme Q-10 30 MG capsule Take 30 mg by  mouth 3 (three) times daily.   doxepin (SINEQUAN) 25 MG capsule Take 1 capsule by mouth daily.   Flaxseed, Linseed, (FLAX SEED OIL PO) Take by mouth daily.   Fluocinolone Acetonide 0.01 % OIL PLACE 1 APPLICATION IN EAR(S) DAILY AS NEEDED.   Magnesium 500 MG TABS Take 1 capsule by mouth daily.   Multiple Vitamin (ONE-DAILY MULTI-VITAMIN) PACK Take by mouth daily.   OVER THE COUNTER MEDICATION Super Beets   Probiotic Product (PROBIOTIC PO) Take by mouth daily.   Resveratrol 100 MG CAPS Take 1 capsule by mouth daily.   thyroid (ARMOUR) 15 MG tablet Take 15 mg by mouth.   triamcinolone 0.1%-silver sulfadiazine 1:1 cream mixture Apply 1 application  topically 2 (two) times daily as needed.   No facility-administered encounter medications on file as of 08/19/2022.    Allergies  Allergen Reactions   Aspirin     REACTION: GI intol   Penicillins      Review of Systems  Constitutional:  Negative for fatigue and fever.  HENT:  Negative for congestion and sore throat.   Eyes:  Negative for visual disturbance.  Respiratory:  Negative for cough.   Cardiovascular:  Negative for chest pain.  Gastrointestinal:  Negative for abdominal pain and nausea.  Genitourinary:  Negative for dysuria.  Musculoskeletal:  Negative for back pain and joint swelling.  Skin:  Negative for rash.  Neurological:  Negative for weakness and headaches.  Hematological:  Negative for adenopathy.    PE; Blood pressure 130/79, pulse 78, temperature 97.7 F (36.5 C), height '5\' 7"'$  (1.702 m), weight 201 lb 9.6 oz (91.4 kg), SpO2 97 %.Body mass index is 31.58 kg/m.   Physical Exam  Gen: Alert, well appearing.  Patient is oriented to person, place, time, and situation. AFFECT: pleasant, lucid thought and speech. Hands: Mild stiffness of all fingers at the PIP joints.  No erythema or soft tissue swelling.  No tenderness.  She has some palpable Bouchard's nodes. Range of motion intact. Right foot with mild tenderness to  palpation over the cuboid-fifth metatarsal joint. No erythema or soft tissue swelling.  Range of motion of the ankle and foot intact and without pain.  Pertinent labs:  Last CBC Lab Results  Component Value Date   WBC 9.2 08/01/2009   HGB 13.2 08/01/2009   HCT 39.9 08/01/2009   MCV 88.9 08/01/2009   RDW 13.0 08/01/2009   PLT 300 08/01/2009  Last metabolic panel Lab Results  Component Value Date   GLUCOSE 97 11/16/2009   NA 138 11/16/2009   K 4.6 11/16/2009   CL 102 11/16/2009   CO2 25 11/16/2009   BUN 17 11/16/2009   CREATININE 0.68 11/16/2009   CALCIUM 8.9 11/16/2009   PROT 7.4 11/16/2009   ALBUMIN 4.4 11/16/2009   BILITOT 0.4 11/16/2009   ALKPHOS 103 11/16/2009   AST 15 11/16/2009   ALT 16 11/16/2009   Last lipids Lab Results  Component Value Date   CHOL 214 (H) 11/16/2009   HDL 47 11/16/2009   LDLCALC 129 (H) 11/16/2009   TRIG 191 (H) 11/16/2009   CHOLHDL 4.6 Ratio 11/16/2009   Last thyroid functions Lab Results  Component Value Date   TSH 0.910 11/16/2009   ASSESSMENT AND PLAN:   New patient, establishing care.  1.  Finger pain (PIPs) , suspect osteoarthritis.  Lower suspicion of psoriatic arthritis. Mild intensity.  Plan is observation.  #2 right foot pain, suspect osteoarthritis.  Reassured.  3.  Chronic anxiety. Doxepin 25 mg a day and alprazolam 0.5 mg twice daily as needed--long-term, doing well.  No new prescriptions needed today.  4.  Allergic rhinitis. Discussed use of nonsedating over-the-counter antihistamine daily.  Also, encouraged use of her Nasacort daily.  Continue saline nasal spray.  Avoid topical and systemic decongestants.  5.  Hypothyroidism, acquired.  Armour Thyroid 90 mg daily.  She has palpitations when taking levothyroxine. She has her final follow-up with her current PCP before he retires set for next week.  She will get labs at that time and drop these off for me to put in her records and review.  #6 hypercholesterolemia.   Doing well on rosuvastatin 5 mg daily. As noted in #5 above, labs to be done soon.  An After Visit Summary was printed and given to the patient.  No follow-ups on file.  Signed:  Crissie Sickles, MD           08/19/2022

## 2022-09-02 ENCOUNTER — Ambulatory Visit: Payer: Self-pay | Admitting: Family Medicine

## 2022-09-17 ENCOUNTER — Telehealth: Payer: Self-pay | Admitting: Family Medicine

## 2022-09-17 NOTE — Telephone Encounter (Signed)
Left message for patient to schedule Annual Wellness Visit.  Please schedule, at [facility]. Please call [phone] ask for [facility]

## 2022-10-23 ENCOUNTER — Ambulatory Visit (INDEPENDENT_AMBULATORY_CARE_PROVIDER_SITE_OTHER): Payer: Medicare Other

## 2022-10-23 VITALS — Wt 201.0 lb

## 2022-10-23 DIAGNOSIS — Z1231 Encounter for screening mammogram for malignant neoplasm of breast: Secondary | ICD-10-CM | POA: Diagnosis not present

## 2022-10-23 DIAGNOSIS — Z0000 Encounter for general adult medical examination without abnormal findings: Secondary | ICD-10-CM

## 2022-10-23 NOTE — Progress Notes (Signed)
I connected with  Eilleen Kempf on 10/23/22 by a audio enabled telemedicine application and verified that I am speaking with the correct person using two identifiers.  Patient Location: Home  Provider Location: Home Office  I discussed the limitations of evaluation and management by telemedicine. The patient expressed understanding and agreed to proceed.   Subjective:   Courtney Kelley is a 72 y.o. female who presents for an Initial Medicare Annual Wellness Visit.  Review of Systems     Cardiac Risk Factors include: advanced age (>9mn, >>70women);dyslipidemia;hypertension;obesity (BMI >30kg/m2)     Objective:    Today's Vitals   10/23/22 1429  Weight: 201 lb (91.2 kg)   Body mass index is 31.48 kg/m.     10/23/2022    2:50 PM  Advanced Directives  Does Patient Have a Medical Advance Directive? Yes  Type of AParamedicof AScoobaLiving will  Copy of [place]? No - copy requested    Current Medications (verified) Outpatient Encounter Medications as of 10/23/2022  Medication Sig   ALPRAZolam (XANAX) 0.5 MG tablet Take 0.5 mg by mouth 2 (two) times daily as needed.   APPLE CIDER VINEGAR PO Take by mouth daily.   ARMOUR THYROID 60 MG tablet Take 60 mg by mouth 2 (two) times daily.   ascorbic acid (VITAMIN C) 250 MG tablet Take 500 mg by mouth daily.   calcipotriene (DOVONOX) 0.005 % cream Apply topically 2 (two) times daily.   Cholecalciferol 125 MCG (5000 UT) capsule Take 5,000 Units by mouth daily.   clobetasol cream (TEMOVATE) 04.78% Apply 1 Application topically 2 (two) times daily.   co-enzyme Q-10 30 MG capsule Take 30 mg by mouth 3 (three) times daily.   doxepin (SINEQUAN) 25 MG capsule Take 1 capsule by mouth daily.   Flaxseed, Linseed, (FLAX SEED OIL PO) Take by mouth daily.   Fluocinolone Acetonide 0.01 % OIL PLACE 1 APPLICATION IN EAR(S) DAILY AS NEEDED.   FLUZONE HIGH-DOSE QUADRIVALENT 0.7 ML SUSY    Magnesium 500  MG TABS Take 1 capsule by mouth daily.   Multiple Vitamin (ONE-DAILY MULTI-VITAMIN) PACK Take by mouth daily.   OVER THE COUNTER MEDICATION Super Beets   Probiotic Product (PROBIOTIC PO) Take by mouth daily.   Resveratrol 100 MG CAPS Take 1 capsule by mouth daily.   thyroid (ARMOUR) 15 MG tablet Take 15 mg by mouth.   triamcinolone 0.1%-silver sulfadiazine 1:1 cream mixture Apply 1 application  topically 2 (two) times daily as needed.   ezetimibe (ZETIA) 10 MG tablet Take 10 mg by mouth daily. (Patient not taking: Reported on 10/23/2022)   No facility-administered encounter medications on file as of 10/23/2022.    Allergies (verified) Aspirin and Penicillins   History: Past Medical History:  Diagnosis Date   Anxiety    Asthma    Hay fever    Hypercholesterolemia    Hypothyroidism    LBBB (left bundle branch block)    Osteoarthritis    Psoriasis    Past Surgical History:  Procedure Laterality Date   CARDIAC CATHETERIZATION  2022   nonosbst cad   DOBUTAMINE STRESS ECHO  2023   +WMA->?ischemia? EF 55%-->f/u cath clean   LEG / ANKLE SOFT TISSUE BIOPSY  12/2021   Family History  Problem Relation Age of Onset   Arthritis Mother    Cancer Mother    Diabetes Mother    Heart disease Mother    High Cholesterol Mother  High blood pressure Mother    Alcohol abuse Father    Cancer Father    COPD Father    Early death Father    Heart attack Father    Depression Sister    Depression Sister    Alcohol abuse Sister    COPD Sister    Diabetes Sister    High Cholesterol Sister    Mental illness Sister    Drug abuse Sister    High Cholesterol Brother    Drug abuse Daughter    Heart disease Maternal Grandmother    Arthritis Maternal Grandmother    COPD Maternal Grandmother    Heart disease Maternal Grandfather    Stroke Paternal Grandmother    Heart attack Paternal Grandfather    Social History   Socioeconomic History   Marital status: Married    Spouse name: Not on file    Number of children: Not on file   Years of education: Not on file   Highest education level: Associate degree: occupational, Hotel manager, or vocational program  Occupational History   Not on file  Tobacco Use   Smoking status: Former    Types: Cigarettes    Quit date: 1984    Years since quitting: 40.1   Smokeless tobacco: Never  Substance and Sexual Activity   Alcohol use: Not on file   Drug use: Never   Sexual activity: Not on file  Other Topics Concern   Not on file  Social History Narrative   Married to [place].   Educ: 1 yr business school   Occup: Chiropractor   No T/A   Social Determinants of Health   Financial Resource Strain: Low Risk  (10/23/2022)   Overall Financial Resource Strain (CARDIA)    Difficulty of Paying Living Expenses: Not hard at all  Food Insecurity: No Food Insecurity (10/23/2022)   Hunger Vital Sign    Worried About Running Out of Food in the Last Year: Never true    Ran Out of Food in the Last Year: Never true  Transportation Needs: No Transportation Needs (10/23/2022)   PRAPARE - Hydrologist (Medical): No    Lack of Transportation (Non-Medical): No  Physical Activity: Insufficiently Active (10/23/2022)   Exercise Vital Sign    Days of Exercise per Week: 3 days    Minutes of Exercise per Session: 20 min  Stress: No Stress Concern Present (10/23/2022)   [place]    Feeling of Stress : Not at all  Recent Concern: Stress - Stress Concern Present (09/23/2022)   [place]    Feeling of Stress : To some extent  Social Connections: Moderately Integrated (10/23/2022)   Social Connection and Isolation Panel [NHANES]    Frequency of Communication with Friends and Family: More than three times a week    Frequency of Social Gatherings with Friends and Family: More than three times a week    Attends  Religious Services: More than 4 times per year    Active Member of [employer] or Organizations: No    Attends Archivist Meetings: Never    Marital Status: Married    Tobacco Counseling Counseling given: Not Answered   Clinical Intake:  Pre-visit preparation completed: Yes  Pain : No/denies pain     BMI - recorded: 31.48 Nutritional Status: BMI > 30  Obese Nutritional Risks: None Diabetes: No  How often do you need  to have someone help you when you read instructions, pamphlets, or other written materials from your doctor or pharmacy?: 1 - Never  Diabetic?no  Interpreter Needed?: No  Information entered by :: Charlott Rakes, LPN   Activities of Daily Living    10/23/2022    2:51 PM  In your present state of health, do you have any difficulty performing the following activities:  Hearing? 0  Vision? 0  Difficulty concentrating or making decisions? 0  Walking or climbing stairs? 0  Dressing or bathing? 0  Doing errands, shopping? 0  Preparing Food and eating ? N  Using the Toilet? N  In the past six months, have you accidently leaked urine? N  Do you have problems with loss of bowel control? N  Managing your Medications? N  Managing your Finances? N  Housekeeping or managing your Housekeeping? N    Patient Care Team: Tammi Sou, MD as PCP - General (Family Medicine)  Indicate any recent Medical Services you may have received from other than [facility] providers in the past year (date may be approximate).     Assessment:   This is a routine wellness examination for [place].  Hearing/Vision screen Hearing Screening - Comments:: Pt denies any hearing issues  Vision Screening - Comments:: Pt follows up with Dr Kenton Kingfisher for annual eye exams   Dietary issues and exercise activities discussed: Current Exercise Habits: Home exercise routine, Time (Minutes): 20, Frequency (Times/Week): 3, Weekly Exercise (Minutes/Week): 60   Goals Addressed              This Visit's Progress    Patient Stated       Lose weight        Depression Screen    10/23/2022    2:48 PM 08/19/2022   10:10 AM  PHQ 2/9 Scores  PHQ - 2 Score 0 0    Fall Risk    10/23/2022    2:51 PM 08/19/2022   10:10 AM  [place] in the past year? 0   Number falls in past yr: 0 0  Injury with Fall? 0 0  Risk for fall due to : Impaired vision Impaired vision  Follow up Falls prevention discussed Falls evaluation completed    FALL RISK PREVENTION PERTAINING TO THE HOME:  Any stairs in or around the home? No  If so, are there any without handrails? No  Home free of loose throw rugs in walkways, pet beds, electrical cords, etc? Yes  Adequate lighting in your home to reduce risk of falls? Yes   ASSISTIVE DEVICES UTILIZED TO PREVENT FALLS:  Life alert? Yes  Use of a cane, walker or w/c? No  Grab bars in the bathroom? Yes  Shower chair or bench in shower? Yes  Elevated toilet seat or a handicapped toilet? No   TIMED UP AND GO:  Was the test performed? No .   Cognitive Function:        10/23/2022    2:51 PM  6CIT Screen  What Year? 0 points  What month? 0 points  What time? 0 points  Count back from 20 0 points  Months in reverse 0 points  Repeat phrase 0 points  Total Score 0 points    Immunizations Immunization History  Administered Date(s) Administered   Influenza-Unspecified 07/10/2022   Moderna Sars-Covid-2 Vaccination 03/24/2020, 04/22/2020   Pneumococcal Polysaccharide-23 07/13/2013   Tdap 07/13/2013   Zoster Recombinat (Shingrix) 06/12/2017    TDAP status: Up to date  Flu Vaccine status: Up to date  Pneumococcal vaccine status: Due, Education has been provided regarding the importance of this vaccine. Advised may receive this vaccine at local pharmacy or Health Dept. Aware to provide a copy of the vaccination record if obtained from local pharmacy or Health Dept. Verbalized acceptance and understanding.  Covid-19 vaccine status:  Completed vaccines  Qualifies for Shingles Vaccine? Yes   Zostavax completed Yes   Shingrix Completed?: No.    Education has been provided regarding the importance of this vaccine. Patient has been advised to call insurance company to determine out of pocket expense if they have not yet received this vaccine. Advised may also receive vaccine at local pharmacy or Health Dept. Verbalized acceptance and understanding.  Screening Tests Health Maintenance  Topic Date Due   Hepatitis C Screening  Never done   MAMMOGRAM  08/20/2015   Zoster Vaccines- Shingrix (2 of 2) 08/07/2017   Pneumonia Vaccine 61+ Years old (2 - PCV) 01/21/2018   COVID-19 Vaccine (3 - Moderna risk series) 05/20/2020   COLONOSCOPY (Pts 45-52yr Insurance coverage will need to be confirmed)  10/24/2023 (Originally 01/21/1998)   DTaP/Tdap/Td (2 - Td or Tdap) 07/14/2023   Medicare Annual Wellness (AWV)  10/24/2023   INFLUENZA VACCINE  Completed   DEXA SCAN  Completed   HPV VACCINES  Aged Out    Health Maintenance  Health Maintenance Due  Topic Date Due   Hepatitis C Screening  Never done   MAMMOGRAM  08/20/2015   Zoster Vaccines- Shingrix (2 of 2) 08/07/2017   Pneumonia Vaccine 70 Years old (2 - PCV) 01/21/2018   COVID-19 Vaccine (3 - Moderna risk series) 05/20/2020    Colonoscopy postponed at this time   Mammogram status: Ordered 10/23/22. Pt provided with contact info and advised to call to schedule appt.       Additional Screening:  Hepatitis C Screening: does qualify;  Vision Screening: Recommended annual ophthalmology exams for early detection of glaucoma and other disorders of the eye. Is the patient up to date with their annual eye exam?  Yes  Who is the provider or what is the name of the office in which the patient attends annual eye exams? Dr HKenton Kingfisher If pt is not established with a provider, would they like to be referred to a provider to establish care? No .   Dental Screening: Recommended annual  dental exams for proper oral hygiene  Community Resource Referral / Chronic Care Management: CRR required this visit?  No   CCM required this visit?  No      Plan:     I have personally reviewed and noted the following in the patient's chart:   Medical and social history Use of alcohol, tobacco or illicit drugs  Current medications and supplements including opioid prescriptions. Patient is not currently taking opioid prescriptions. Functional ability and status Nutritional status Physical activity Advanced directives List of other physicians Hospitalizations, surgeries, and ER visits in previous 12 months Vitals Screenings to include cognitive, depression, and falls Referrals and appointments  In addition, I have reviewed and discussed with patient certain preventive protocols, quality metrics, and best practice recommendations. A written personalized care plan for preventive services as well as general preventive health recommendations were provided to patient.     TWillette Brace LPN   28/04/3373  Nurse Notes: none

## 2022-10-23 NOTE — Patient Instructions (Signed)
Courtney Kelley , Thank you for taking time to come for your Medicare Wellness Visit. I appreciate your ongoing commitment to your health goals. Please review the following plan we discussed and let me know if I can assist you in the future.   These are the goals we discussed:  Goals   None     This is a list of the screening recommended for you and due dates:  Health Maintenance  Topic Date Due   Medicare Annual Wellness Visit  Never done   Hepatitis C Screening: USPSTF Recommendation to screen - Ages 21-79 yo.  Never done   Colon Cancer Screening  Never done   Mammogram  08/20/2015   Zoster (Shingles) Vaccine (2 of 2) 08/07/2017   Pneumonia Vaccine (2 - PCV) 01/21/2018   COVID-19 Vaccine (3 - Moderna risk series) 05/20/2020   DTaP/Tdap/Td vaccine (2 - Td or Tdap) 07/14/2023   Flu Shot  Completed   DEXA scan (bone density measurement)  Completed   HPV Vaccine  Aged Out    Advanced directives: Advance directive discussed with you today. Even though you declined this today please call our office should you change your mind and we can give you the proper paperwork for you to fill out.  Conditions/risks identified: lose some weight   Next appointment: Follow up in one year for your annual wellness visit    Preventive Care 65 Years and Older, Female Preventive care refers to lifestyle choices and visits with your health care provider that can promote health and wellness. What does preventive care include? A yearly physical exam. This is also called an annual well check. Dental exams once or twice a year. Routine eye exams. Ask your health care provider how often you should have your eyes checked. Personal lifestyle choices, including: Daily care of your teeth and gums. Regular physical activity. Eating a healthy diet. Avoiding tobacco and drug use. Limiting alcohol use. Practicing safe sex. Taking low-dose aspirin every day. Taking vitamin and mineral supplements as recommended by  your health care provider. What happens during an annual well check? The services and screenings done by your health care provider during your annual well check will depend on your age, overall health, lifestyle risk factors, and family history of disease. Counseling  Your health care provider may ask you questions about your: Alcohol use. Tobacco use. Drug use. Emotional well-being. Home and relationship well-being. Sexual activity. Eating habits. History of falls. Memory and ability to understand (cognition). Work and work Statistician. Reproductive health. Screening  You may have the following tests or measurements: Height, weight, and BMI. Blood pressure. Lipid and cholesterol levels. These may be checked every 5 years, or more frequently if you are over 60 years old. Skin check. Lung cancer screening. You may have this screening every year starting at age 17 if you have a 30-pack-year history of smoking and currently smoke or have quit within the past 15 years. Fecal occult blood test (FOBT) of the stool. You may have this test every year starting at age 19. Flexible sigmoidoscopy or colonoscopy. You may have a sigmoidoscopy every 5 years or a colonoscopy every 10 years starting at age 41. Hepatitis C blood test. Hepatitis B blood test. Sexually transmitted disease (STD) testing. Diabetes screening. This is done by checking your blood sugar (glucose) after you have not eaten for a while (fasting). You may have this done every 1-3 years. Bone density scan. This is done to screen for osteoporosis. You may have this  done starting at age 68. Mammogram. This may be done every 1-2 years. Talk to your health care provider about how often you should have regular mammograms. Talk with your health care provider about your test results, treatment options, and if necessary, the need for more tests. Vaccines  Your health care provider may recommend certain vaccines, such as: Influenza  vaccine. This is recommended every year. Tetanus, diphtheria, and acellular pertussis (Tdap, Td) vaccine. You may need a Td booster every 10 years. Zoster vaccine. You may need this after age 46. Pneumococcal 13-valent conjugate (PCV13) vaccine. One dose is recommended after age 75. Pneumococcal polysaccharide (PPSV23) vaccine. One dose is recommended after age 18. Talk to your health care provider about which screenings and vaccines you need and how often you need them. This information is not intended to replace advice given to you by your health care provider. Make sure you discuss any questions you have with your health care provider. Document Released: 09/29/2015 Document Revised: 05/22/2016 Document Reviewed: 07/04/2015 Elsevier Interactive Patient Education  2017 [employer] Prevention in the Home Falls can cause injuries. They can happen to people of all ages. There are many things you can do to make your home safe and to help prevent falls. What can I do on the outside of my home? Regularly fix the edges of walkways and driveways and fix any cracks. Remove anything that might make you trip as you walk through a door, such as a raised step or threshold. Trim any bushes or trees on the path to your home. Use bright outdoor lighting. Clear any walking paths of anything that might make someone trip, such as rocks or tools. Regularly check to see if handrails are loose or broken. Make sure that both sides of any steps have handrails. Any raised decks and porches should have guardrails on the edges. Have any leaves, snow, or ice cleared regularly. Use sand or salt on walking paths during winter. Clean up any spills in your garage right away. This includes oil or grease spills. What can I do in the bathroom? Use night lights. Install grab bars by the toilet and in the tub and shower. Do not use towel bars as grab bars. Use non-skid mats or decals in the tub or shower. If you  need to sit down in the shower, use a plastic, non-slip stool. Keep the floor dry. Clean up any water that spills on the floor as soon as it happens. Remove soap buildup in the tub or shower regularly. Attach bath mats securely with double-sided non-slip rug tape. Do not have throw rugs and other things on the floor that can make you trip. What can I do in the bedroom? Use night lights. Make sure that you have a light by your bed that is easy to reach. Do not use any sheets or blankets that are too big for your bed. They should not hang down onto the floor. Have a firm chair that has side arms. You can use this for support while you get dressed. Do not have throw rugs and other things on the floor that can make you trip. What can I do in the kitchen? Clean up any spills right away. Avoid walking on wet floors. Keep items that you use a lot in easy-to-reach places. If you need to reach something above you, use a strong step stool that has a grab bar. Keep electrical cords out of the way. Do not use floor polish or wax  that makes floors slippery. If you must use wax, use non-skid floor wax. Do not have throw rugs and other things on the floor that can make you trip. What can I do with my stairs? Do not leave any items on the stairs. Make sure that there are handrails on both sides of the stairs and use them. Fix handrails that are broken or loose. Make sure that handrails are as long as the stairways. Check any carpeting to make sure that it is firmly attached to the stairs. Fix any carpet that is loose or worn. Avoid having throw rugs at the top or bottom of the stairs. If you do have throw rugs, attach them to the floor with carpet tape. Make sure that you have a light switch at the top of the stairs and the bottom of the stairs. If you do not have them, ask someone to add them for you. What else can I do to help prevent falls? Wear shoes that: Do not have high heels. Have rubber  bottoms. Are comfortable and fit you well. Are closed at the toe. Do not wear sandals. If you use a stepladder: Make sure that it is fully opened. Do not climb a closed stepladder. Make sure that both sides of the stepladder are locked into place. Ask someone to hold it for you, if possible. Clearly mark and make sure that you can see: Any grab bars or handrails. First and last steps. Where the edge of each step is. Use tools that help you move around (mobility aids) if they are needed. These include: Canes. Walkers. Scooters. Crutches. Turn on the lights when you go into a dark area. Replace any light bulbs as soon as they burn out. Set up your furniture so you have a clear path. Avoid moving your furniture around. If any of your floors are uneven, fix them. If there are any pets around you, be aware of where they are. Review your medicines with your doctor. Some medicines can make you feel dizzy. This can increase your chance of falling. Ask your doctor what other things that you can do to help prevent falls. This information is not intended to replace advice given to you by your health care provider. Make sure you discuss any questions you have with your health care provider. Document Released: 06/29/2009 Document Revised: 02/08/2016 Document Reviewed: 10/07/2014 Elsevier Interactive Patient Education  2017 [employer].

## 2022-12-20 DIAGNOSIS — L821 Other seborrheic keratosis: Secondary | ICD-10-CM | POA: Diagnosis not present

## 2022-12-20 DIAGNOSIS — D1801 Hemangioma of skin and subcutaneous tissue: Secondary | ICD-10-CM | POA: Diagnosis not present

## 2022-12-20 DIAGNOSIS — L409 Psoriasis, unspecified: Secondary | ICD-10-CM | POA: Diagnosis not present

## 2022-12-20 DIAGNOSIS — L810 Postinflammatory hyperpigmentation: Secondary | ICD-10-CM | POA: Diagnosis not present

## 2022-12-20 DIAGNOSIS — L814 Other melanin hyperpigmentation: Secondary | ICD-10-CM | POA: Diagnosis not present

## 2023-01-28 NOTE — Progress Notes (Signed)
[facility] Quality Team Note  Name: Courtney Kelley Date of Birth: 1951-06-30 MRN: [id number] Date: 01/28/2023  [facility] Quality Team has reviewed this patient's chart, please see recommendations below:  [facility] Quality Other; (COL Gap- Patient has upcoming appt with [facility] 07/14/2023. Please offer FOBT / Cologuard Kit or Colonoscopy. )

## 2023-03-12 ENCOUNTER — Encounter: Payer: Self-pay | Admitting: Family Medicine

## 2023-03-12 ENCOUNTER — Ambulatory Visit (INDEPENDENT_AMBULATORY_CARE_PROVIDER_SITE_OTHER): Payer: Medicare Other | Admitting: Family Medicine

## 2023-03-12 VITALS — BP 144/77 | HR 69 | Wt 205.0 lb

## 2023-03-12 DIAGNOSIS — R42 Dizziness and giddiness: Secondary | ICD-10-CM | POA: Diagnosis not present

## 2023-03-12 DIAGNOSIS — J302 Other seasonal allergic rhinitis: Secondary | ICD-10-CM

## 2023-03-12 MED ORDER — PREDNISONE 10 MG PO TABS: ORAL_TABLET | ORAL | 0 refills | Status: DC

## 2023-03-12 NOTE — Progress Notes (Signed)
OFFICE VISIT  03/12/2023  CC:  Chief Complaint  Patient presents with   Sinus Problem    Pressure behind eyes and face. Causing some balance issues. Pt states she went to the pool on Sunday and got water in her ear. Ears are still bothering her.     Patient is a 72 y.o. female who presents for sinus pressure.  HPI: Onset 3 days ago--sinus pressure over forehead and behind eyes.  Ears hurting.  Nasal congestion but no mucus.  No sneezing.  No sore throat, no cough, no wheeze. No fever. She has a sensation of feeling dizzy but does not fully endorse vertigo.  She says she has had vertigo before but this is not it.  She feels a little bit nausea but she relates this to just feeling bad overall.  She feels some dizziness with just sitting but when she gets up from sitting and when she walks around it is worse. She started nasal steroid the day before her symptoms started.  No focal weakness, no tremor, no visual abnormalities.  No slurred speech, no swallowing problems.  No rash, no myalgias or arthralgias.  Past Medical History:  Diagnosis Date   Anxiety    Asthma    Hay fever    Hypercholesterolemia    Hypothyroidism    LBBB (left bundle branch block)    Osteoarthritis    Psoriasis     Past Surgical History:  Procedure Laterality Date   CARDIAC CATHETERIZATION  2022   nonosbst cad   DOBUTAMINE STRESS ECHO  2023   +WMA->?ischemia? EF 55%-->f/u cath clean   LEG / ANKLE SOFT TISSUE BIOPSY  12/2021    Outpatient Medications Prior to Visit  Medication Sig Dispense Refill   ALPRAZolam (XANAX) 0.5 MG tablet Take 0.5 mg by mouth 2 (two) times daily as needed.     APPLE CIDER VINEGAR PO Take by mouth daily.     ARMOUR THYROID 60 MG tablet Take 60 mg by mouth 2 (two) times daily.     ascorbic acid (VITAMIN C) 250 MG tablet Take 500 mg by mouth daily.     calcipotriene (DOVONOX) 0.005 % cream Apply topically 2 (two) times daily.     Cholecalciferol 125 MCG (5000 UT) capsule Take  5,000 Units by mouth daily.     clobetasol cream (TEMOVATE) 0.05 % Apply 1 Application topically 2 (two) times daily.     co-enzyme Q-10 30 MG capsule Take 30 mg by mouth 3 (three) times daily.     doxepin (SINEQUAN) 25 MG capsule Take 1 capsule by mouth daily.     Flaxseed, Linseed, (FLAX SEED OIL PO) Take by mouth daily.     Fluocinolone Acetonide 0.01 % OIL PLACE 1 APPLICATION IN EAR(S) DAILY AS NEEDED.     FLUZONE HIGH-DOSE QUADRIVALENT 0.7 ML SUSY      Magnesium 500 MG TABS Take 1 capsule by mouth daily.     Multiple Vitamin (ONE-DAILY MULTI-VITAMIN) PACK Take by mouth daily.     OVER THE COUNTER MEDICATION Super Beets     Probiotic Product (PROBIOTIC PO) Take by mouth daily.     Resveratrol 100 MG CAPS Take 1 capsule by mouth daily.     thyroid (ARMOUR) 15 MG tablet Take 15 mg by mouth.     triamcinolone 0.1%-silver sulfadiazine 1:1 cream mixture Apply 1 application  topically 2 (two) times daily as needed.     ezetimibe (ZETIA) 10 MG tablet Take 10 mg by mouth daily. (Patient  not taking: Reported on 10/23/2022)     No facility-administered medications prior to visit.    Allergies  Allergen Reactions   Aspirin     REACTION: GI intol   Penicillins     Review of Systems  As per HPI  PE:    03/12/2023    1:06 PM 10/23/2022    2:29 PM 08/19/2022    9:55 AM  Vitals with BMI  Height   5\' 7"   Weight 205 lbs 201 lbs 201 lbs 10 oz  BMI   31.57  Systolic 144  130  Diastolic 77  79  Pulse 69  78     Physical Exam  Gen: Alert, well appearing.  Patient is oriented to person, place, time, and situation. AFFECT: pleasant, lucid thought and speech. ENT: Ears: EACs clear, normal epithelium.  TMs with good light reflex and landmarks bilaterally.  Eyes: no injection, icteris, swelling, or exudate.  EOMI, PERRLA. Nose: no drainage or turbinate edema/swelling.  No injection or focal lesion.  Mouth: lips without lesion/swelling.  Oral mucosa pink and moist.  Dentition intact and without  obvious caries or gingival swelling.  Oropharynx without erythema, exudate, or swelling.  Neck - No masses or thyromegaly or limitation in range of motion CV: RRR, no m/r/g.   LUNGS: CTA bilat, nonlabored resps, good aeration in all lung fields. Dix-Halpike: No vertigo or nystagmus  LABS:  Last metabolic panel Lab Results  Component Value Date   GLUCOSE 97 11/16/2009   NA 138 11/16/2009   K 4.6 11/16/2009   CL 102 11/16/2009   CO2 25 11/16/2009   BUN 17 11/16/2009   CREATININE 0.68 11/16/2009   CALCIUM 8.9 11/16/2009   PROT 7.4 11/16/2009   ALBUMIN 4.4 11/16/2009   BILITOT 0.4 11/16/2009   ALKPHOS 103 11/16/2009   AST 15 11/16/2009   ALT 16 11/16/2009   Lab Results  Component Value Date   TSH 0.910 11/16/2009   IMPRESSION AND PLAN:  Acute allergic rhinosinusitis. Prednisone 10 mg a day x 5 days (she is very apprehensive about this medication and prefers "less is more "approach).  She will take her Nasacort daily and take saline nasal spray multiple times a day. Okay to leave antihistamine off. She states she is hydrating well.  An After Visit Summary was printed and given to the patient.  FOLLOW UP: No follow-ups on file.  Signed:  Santiago Bumpers, MD           03/12/2023

## 2023-05-01 DIAGNOSIS — J309 Allergic rhinitis, unspecified: Secondary | ICD-10-CM | POA: Diagnosis not present

## 2023-05-01 DIAGNOSIS — H6993 Unspecified Eustachian tube disorder, bilateral: Secondary | ICD-10-CM | POA: Diagnosis not present

## 2023-05-01 DIAGNOSIS — J342 Deviated nasal septum: Secondary | ICD-10-CM | POA: Diagnosis not present

## 2023-05-01 DIAGNOSIS — R519 Headache, unspecified: Secondary | ICD-10-CM | POA: Diagnosis not present

## 2023-06-09 ENCOUNTER — Other Ambulatory Visit: Payer: Self-pay | Admitting: Family Medicine

## 2023-06-09 DIAGNOSIS — Z1231 Encounter for screening mammogram for malignant neoplasm of breast: Secondary | ICD-10-CM

## 2023-07-10 ENCOUNTER — Ambulatory Visit: Payer: Medicare Other

## 2023-07-10 DIAGNOSIS — Z1231 Encounter for screening mammogram for malignant neoplasm of breast: Secondary | ICD-10-CM | POA: Diagnosis not present

## 2023-07-14 ENCOUNTER — Ambulatory Visit (INDEPENDENT_AMBULATORY_CARE_PROVIDER_SITE_OTHER): Payer: Medicare Other | Admitting: Family Medicine

## 2023-07-14 ENCOUNTER — Telehealth: Payer: Self-pay

## 2023-07-14 ENCOUNTER — Encounter: Payer: Self-pay | Admitting: Family Medicine

## 2023-07-14 VITALS — BP 138/77 | HR 74 | Ht 66.0 in | Wt 198.6 lb

## 2023-07-14 DIAGNOSIS — Z0000 Encounter for general adult medical examination without abnormal findings: Secondary | ICD-10-CM | POA: Diagnosis not present

## 2023-07-14 DIAGNOSIS — E7800 Pure hypercholesterolemia, unspecified: Secondary | ICD-10-CM | POA: Diagnosis not present

## 2023-07-14 DIAGNOSIS — E039 Hypothyroidism, unspecified: Secondary | ICD-10-CM | POA: Diagnosis not present

## 2023-07-14 DIAGNOSIS — E559 Vitamin D deficiency, unspecified: Secondary | ICD-10-CM

## 2023-07-14 DIAGNOSIS — M7711 Lateral epicondylitis, right elbow: Secondary | ICD-10-CM

## 2023-07-14 DIAGNOSIS — Z23 Encounter for immunization: Secondary | ICD-10-CM | POA: Diagnosis not present

## 2023-07-14 DIAGNOSIS — Z1211 Encounter for screening for malignant neoplasm of colon: Secondary | ICD-10-CM

## 2023-07-14 LAB — COMPREHENSIVE METABOLIC PANEL
ALT: 17 U/L (ref 0–35)
AST: 19 U/L (ref 0–37)
Albumin: 4.5 g/dL (ref 3.5–5.2)
Alkaline Phosphatase: 105 U/L (ref 39–117)
BUN: 16 mg/dL (ref 6–23)
CO2: 28 meq/L (ref 19–32)
Calcium: 9.7 mg/dL (ref 8.4–10.5)
Chloride: 101 meq/L (ref 96–112)
Creatinine, Ser: 0.65 mg/dL (ref 0.40–1.20)
GFR: 89.17 mL/min (ref >60.00)
Glucose, Bld: 106 mg/dL — ABNORMAL HIGH (ref 70–99)
Potassium: 4.6 meq/L (ref 3.5–5.1)
Sodium: 139 meq/L (ref 135–145)
Total Bilirubin: 0.5 mg/dL (ref 0.2–1.2)
Total Protein: 7.4 g/dL (ref 6.0–8.3)

## 2023-07-14 LAB — CBC WITH DIFFERENTIAL/PLATELET
Basophils Absolute: 0.1 10*3/uL (ref 0.0–0.1)
Basophils Relative: 0.5 % (ref 0.0–3.0)
Eosinophils Absolute: 0.3 10*3/uL (ref 0.0–0.7)
Eosinophils Relative: 2.8 % (ref 0.0–5.0)
HCT: 46.3 % — ABNORMAL HIGH (ref 36.0–46.0)
Hemoglobin: 15 g/dL (ref 12.0–15.0)
Lymphocytes Relative: 36.3 % (ref 12.0–46.0)
Lymphs Abs: 4.3 10*3/uL — ABNORMAL HIGH (ref 0.7–4.0)
MCHC: 32.4 g/dL (ref 30.0–36.0)
MCV: 89.8 fL (ref 78.0–100.0)
Monocytes Absolute: 0.7 10*3/uL (ref 0.1–1.0)
Monocytes Relative: 5.5 % (ref 3.0–12.0)
Neutro Abs: 6.6 10*3/uL (ref 1.4–7.7)
Neutrophils Relative %: 54.9 % (ref 43.0–77.0)
Platelets: 370 10*3/uL (ref 150.0–400.0)
RBC: 5.15 Mil/uL — ABNORMAL HIGH (ref 3.87–5.11)
RDW: 13.6 % (ref 11.5–15.5)
WBC: 12 10*3/uL — ABNORMAL HIGH (ref 4.0–10.5)

## 2023-07-14 LAB — LIPID PANEL
Cholesterol: 261 mg/dL — ABNORMAL HIGH (ref 0–200)
HDL: 51.3 mg/dL (ref >39.00)
LDL Cholesterol: 166 mg/dL — ABNORMAL HIGH (ref 0–99)
NonHDL: 209.7
Total CHOL/HDL Ratio: 5
Triglycerides: 218 mg/dL — ABNORMAL HIGH (ref 0.0–149.0)
VLDL: 43.6 mg/dL — ABNORMAL HIGH (ref 0.0–40.0)

## 2023-07-14 LAB — VITAMIN D 25 HYDROXY (VIT D DEFICIENCY, FRACTURES): VITD: 43.26 ng/mL (ref 30.00–100.00)

## 2023-07-14 LAB — TSH: TSH: 4.09 u[IU]/mL (ref 0.35–5.50)

## 2023-07-14 MED ORDER — ARMOUR THYROID 60 MG PO TABS: 60.0000 mg | ORAL_TABLET | Freq: Two times a day (BID) | ORAL | 1 refills | Status: DC

## 2023-07-14 MED ORDER — ALPRAZOLAM 0.5 MG PO TABS: 0.5000 mg | ORAL_TABLET | Freq: Two times a day (BID) | ORAL | 1 refills | Status: AC | PRN

## 2023-07-14 MED ORDER — EZETIMIBE 10 MG PO TABS: 10.0000 mg | ORAL_TABLET | Freq: Every day | ORAL | 1 refills | Status: DC

## 2023-07-14 MED ORDER — THYROID 15 MG PO TABS: ORAL_TABLET | ORAL | 1 refills | Status: DC

## 2023-07-14 NOTE — Telephone Encounter (Signed)
Pt advised refill requested sent to the pharmacy and provider recommendations.

## 2023-07-14 NOTE — Progress Notes (Signed)
Office Note 07/14/2023  CC:  Chief Complaint  Patient presents with   Annual Exam    Pt is fasting. Pt inquiring about most recent mammo results.     HPI:  Patient is a 72 y.o. female who is here for annual health maintenance exam and follow-up hypothyroidism, hypercholesterolemia. Verified Armour Thyroid dosing: 60 mg once a day and 2 of the 15 mg tabs once a day (total daily dose 90 mg).  Since retiring a couple of months ago she has been at home doing a lot more repetitive physical work like cleaning the house, working in the garage, etc. Has some pain in the right biceps region as well as right lateral epicondyle. No joint pain.   PMP AWARE reviewed today: most recent rx for alprazolam was filled 02/15/2023, # 90, rx by Gwenevere Ghazi, MD. No red flags.   Past Medical History:  Diagnosis Date   Anxiety    Asthma    Hay fever    Hypercholesterolemia    Hypothyroidism    LBBB (left bundle branch block)    Osteoarthritis    Psoriasis     Past Surgical History:  Procedure Laterality Date   CARDIAC CATHETERIZATION  2022   nonosbst cad   DOBUTAMINE STRESS ECHO  2023   +WMA->?ischemia? EF 55%-->f/u cath clean   LEG / ANKLE SOFT TISSUE BIOPSY  12/2021    Family History  Problem Relation Age of Onset   Arthritis Mother    Cancer Mother    Diabetes Mother    Heart disease Mother    High Cholesterol Mother    High blood pressure Mother    Alcohol abuse Father    Cancer Father    COPD Father    Early death Father    Heart attack Father    Depression Sister    Depression Sister    Alcohol abuse Sister    COPD Sister    Diabetes Sister    High Cholesterol Sister    Mental illness Sister    Drug abuse Sister    High Cholesterol Brother    Drug abuse Daughter    Heart disease Maternal Grandmother    Arthritis Maternal Grandmother    COPD Maternal Grandmother    Heart disease Maternal Grandfather    Stroke Paternal Grandmother    Heart attack Paternal  Grandfather     Social History   Socioeconomic History   Marital status: Married    Spouse name: Not on file   Number of children: Not on file   Years of education: Not on file   Highest education level: Associate degree: occupational, Scientist, product/process development, or vocational program  Occupational History   Not on file  Tobacco Use   Smoking status: Former    Current packs/day: 0.00    Types: Cigarettes    Quit date: 1984    Years since quitting: 40.8   Smokeless tobacco: Never  Substance and Sexual Activity   Alcohol use: Not on file   Drug use: Never   Sexual activity: Not on file  Other Topics Concern   Not on file  Social History Narrative   Married to Castella.   Educ: 1 yr business school   Occup: Advertising account executive   No T/A   Social Determinants of Health   Financial Resource Strain: Low Risk  (10/23/2022)   Overall Financial Resource Strain (CARDIA)    Difficulty of Paying Living Expenses: Not hard at all  Food Insecurity: No Food Insecurity (10/23/2022)  Hunger Vital Sign    Worried About Running Out of Food in the Last Year: Never true    Ran Out of Food in the Last Year: Never true  Transportation Needs: No Transportation Needs (10/23/2022)   PRAPARE - Administrator, Civil Service (Medical): No    Lack of Transportation (Non-Medical): No  Physical Activity: Insufficiently Active (10/23/2022)   Exercise Vital Sign    Days of Exercise per Week: 3 days    Minutes of Exercise per Session: 20 min  Stress: No Stress Concern Present (10/23/2022)   [employer] of Occupational Health - Occupational Stress Questionnaire    Feeling of Stress : Not at all  Recent Concern: Stress - Stress Concern Present (09/23/2022)   [employer] of Occupational Health - Occupational Stress Questionnaire    Feeling of Stress : To some extent  Social Connections: Unknown (03/01/2023)   Received from [facility]   Social Network    Social Network: Not on file  Intimate  Partner Violence: Unknown (03/01/2023)   Received from [facility]   HITS    Physically Hurt: Not on file    Insult or Talk Down To: Not on file    Threaten Physical Harm: Not on file    Scream or Curse: Not on file    Outpatient Medications Prior to Visit  Medication Sig Dispense Refill   ALPRAZolam (XANAX) 0.5 MG tablet Take 0.5 mg by mouth 2 (two) times daily as needed.     APPLE CIDER VINEGAR PO Take by mouth daily.     ascorbic acid (VITAMIN C) 250 MG tablet Take 500 mg by mouth daily.     calcipotriene (DOVONOX) 0.005 % cream Apply topically 2 (two) times daily.     Cholecalciferol 125 MCG (5000 UT) capsule Take 5,000 Units by mouth daily.     clobetasol cream (TEMOVATE) 0.05 % Apply 1 Application topically 2 (two) times daily.     co-enzyme Q-10 30 MG capsule Take 30 mg by mouth 3 (three) times daily.     Flaxseed, Linseed, (FLAX SEED OIL PO) Take by mouth daily.     Fluocinolone Acetonide 0.01 % OIL PLACE 1 APPLICATION IN EAR(S) DAILY AS NEEDED.     Magnesium 500 MG TABS Take 1 capsule by mouth daily.     Multiple Vitamin (ONE-DAILY MULTI-VITAMIN) PACK Take by mouth daily.     OVER THE COUNTER MEDICATION Super Beets     Probiotic Product (PROBIOTIC PO) Take by mouth daily.     Resveratrol 100 MG CAPS Take 1 capsule by mouth daily.     triamcinolone 0.1%-silver sulfadiazine 1:1 cream mixture Apply 1 application  topically 2 (two) times daily as needed.     ARMOUR THYROID 60 MG tablet Take 60 mg by mouth 2 (two) times daily.     ezetimibe (ZETIA) 10 MG tablet Take 10 mg by mouth daily.     FLUZONE HIGH-DOSE QUADRIVALENT 0.7 ML SUSY      predniSONE (DELTASONE) 10 MG tablet 1 tab po every day x 5d 5 tablet 0   thyroid (ARMOUR) 15 MG tablet Take 15 mg by mouth.     doxepin (SINEQUAN) 25 MG capsule Take 1 capsule by mouth daily. (Patient not taking: Reported on 07/14/2023)     No facility-administered medications prior to visit.    Allergies  Allergen Reactions    Aspirin     REACTION: GI intol   Penicillins  Review of Systems  Constitutional:  Negative for appetite change, chills, fatigue and fever.  HENT:  Negative for congestion, dental problem, ear pain and sore throat.   Eyes:  Negative for discharge, redness and visual disturbance.  Respiratory:  Negative for cough, chest tightness, shortness of breath and wheezing.   Cardiovascular:  Negative for chest pain, palpitations and leg swelling.  Gastrointestinal:  Negative for abdominal pain, blood in stool, diarrhea, nausea and vomiting.  Genitourinary:  Negative for difficulty urinating, dysuria, flank pain, frequency, hematuria and urgency.  Musculoskeletal:  Positive for arthralgias (R elbow) and myalgias (R bicep). Negative for back pain, joint swelling and neck stiffness.  Skin:  Negative for pallor and rash.  Neurological:  Negative for dizziness, speech difficulty, weakness and headaches.  Hematological:  Negative for adenopathy. Does not bruise/bleed easily.  Psychiatric/Behavioral:  Negative for confusion and sleep disturbance. The patient is not nervous/anxious.     PE;    07/14/2023    9:53 AM 03/12/2023    1:06 PM 10/23/2022    2:29 PM  Vitals with BMI  Height 5\' 6"     Weight 198 lbs 10 oz 205 lbs 201 lbs  BMI 32.07    Systolic 138 144   Diastolic 77 77   Pulse 74 69    Exam chaperoned by Sammuel Cooper, CMA Gen: Alert, well appearing.  Patient is oriented to person, place, time, and situation. AFFECT: pleasant, lucid thought and speech. ENT: Ears: EACs clear, normal epithelium.  TMs with good light reflex and landmarks bilaterally.  Eyes: no injection, icteris, swelling, or exudate.  EOMI, PERRLA. Nose: no drainage or turbinate edema/swelling.  No injection or focal lesion.  Mouth: lips without lesion/swelling.  Oral mucosa pink and moist.  Dentition intact and without obvious caries or gingival swelling.  Oropharynx without erythema, exudate, or swelling.  Neck:  supple/nontender.  No LAD, mass, or TM.  Carotid pulses 2+ bilaterally, without bruits. CV: RRR, no m/r/g.   LUNGS: CTA bilat, nonlabored resps, good aeration in all lung fields. ABD: soft, NT, ND, BS normal.  No hepatospenomegaly or mass.  No bruits. EXT: no clubbing, cyanosis, or edema.  Musculoskeletal: no joint swelling, erythema, warmth, or tenderness.  ROM of all joints intact. Mild tenderness to palpation over common extensor tendon of right elbow.  Mild pain in same area with resisted right wrist extension. Mild discomfort to palpation focally over the right biceps muscle. Skin - no sores or suspicious lesions or rashes or color changes  Pertinent labs:  Lab Results  Component Value Date   TSH 0.910 11/16/2009   Lab Results  Component Value Date   WBC 9.2 08/01/2009   HGB 13.2 08/01/2009   HCT 39.9 08/01/2009   MCV 88.9 08/01/2009   PLT 300 08/01/2009   Lab Results  Component Value Date   CREATININE 0.68 11/16/2009   BUN 17 11/16/2009   NA 138 11/16/2009   K 4.6 11/16/2009   CL 102 11/16/2009   CO2 25 11/16/2009   Lab Results  Component Value Date   ALT 16 11/16/2009   AST 15 11/16/2009   ALKPHOS 103 11/16/2009   BILITOT 0.4 11/16/2009   Lab Results  Component Value Date   CHOL 214 (H) 11/16/2009   Lab Results  Component Value Date   HDL 47 11/16/2009   Lab Results  Component Value Date   LDLCALC 129 (H) 11/16/2009   Lab Results  Component Value Date   TRIG 191 (H) 11/16/2009   Lab  Results  Component Value Date   CHOLHDL 4.6 Ratio 11/16/2009   ASSESSMENT AND PLAN:   #1 health maintenance exam: Reviewed age and gender appropriate health maintenance issues (prudent diet, regular exercise, health risks of tobacco and excessive alcohol, use of seatbelts, fire alarms in home, use of sunscreen).  Also reviewed age and gender appropriate health screening as well as vaccine recommendations. Vaccines: Shingrix up-to-date..  Flu->UTD.  Tdap->rx to pharm.   Prevnar 20->given today. Labs: Fasting health panel Cervical ca screening: No further screening due to age Breast ca screening: mammogram UTD 06/2023. Colon ca screening: She has never had screening because of fear of colonoscopy procedure. Options reviewed-->cologuard ordered today.  #2 acquired hypothyroidism. TSH monitoring today. Continue Armour Thyroid 60 mg tab/day and 2 of the 15 mg tabs a day.  #3 hypercholesterolemia. Tolerating Zetia 10 mg a day. Lipid panel today.  #4 overuse injury-> mild biceps muscle strain. Mild right arm lateral epicondylitis. Relative rest recommended.  #5 vitamin D deficiency. She takes 5000 U daily. Vitamin D level checked today.  An After Visit Summary was printed and given to the patient.  FOLLOW UP:  Return in about 6 months (around 01/12/2024) for routine chronic illness f/u.  Signed:  Santiago Bumpers, MD           07/14/2023

## 2023-07-14 NOTE — Telephone Encounter (Signed)
Patient seen today.  She states that she no longer takes  ezetimibe (ZETIA) 10 MG tablet. She mentioned to Dr. Milinda Cave that she needed refill for Alprazolam.  She no longer sees the provider who started her on medication.  [facility] [place].

## 2023-07-14 NOTE — Patient Instructions (Signed)

## 2023-07-14 NOTE — Telephone Encounter (Signed)
Please fill, if appropriate.  

## 2023-07-14 NOTE — Telephone Encounter (Signed)
Okay. Alprazolam sent in. I will take Zetia off of her med list. Remind her that I must see her in a minimum of every 6 months in the office.

## 2023-07-15 ENCOUNTER — Other Ambulatory Visit: Payer: Self-pay | Admitting: Family Medicine

## 2023-07-15 ENCOUNTER — Telehealth: Payer: Self-pay

## 2023-07-15 DIAGNOSIS — D72829 Elevated white blood cell count, unspecified: Secondary | ICD-10-CM

## 2023-07-15 NOTE — Telephone Encounter (Signed)
Is this ok?

## 2023-07-15 NOTE — Telephone Encounter (Signed)
Patient stated that Dr. Milinda Cave would like for her to repeat WBC in another month. Patient asked if she could repeat cholesterol (will watch food intake,etc - pt prefer not to go on meds) and also asked if she could test A1C.  If approved, please add orders. Patient is scheduled for 11/26 for lab visit. Patient will be fasting.

## 2023-07-16 ENCOUNTER — Other Ambulatory Visit: Payer: Self-pay

## 2023-07-16 DIAGNOSIS — E7800 Pure hypercholesterolemia, unspecified: Secondary | ICD-10-CM

## 2023-07-16 DIAGNOSIS — R7301 Impaired fasting glucose: Secondary | ICD-10-CM

## 2023-07-16 NOTE — Telephone Encounter (Signed)
Yes this is okay.  Please add orders.  Diagnoses hypercholesterolemia and impaired fasting glucose.

## 2023-07-16 NOTE — Telephone Encounter (Signed)
Orders placed and pt advised.

## 2023-08-11 ENCOUNTER — Encounter: Payer: Self-pay | Admitting: Family Medicine

## 2023-08-11 NOTE — Telephone Encounter (Signed)
Pt scheduled for 11/26 for lab.

## 2023-08-12 ENCOUNTER — Other Ambulatory Visit (INDEPENDENT_AMBULATORY_CARE_PROVIDER_SITE_OTHER): Payer: Medicare Other

## 2023-08-12 DIAGNOSIS — E7800 Pure hypercholesterolemia, unspecified: Secondary | ICD-10-CM

## 2023-08-12 DIAGNOSIS — R7301 Impaired fasting glucose: Secondary | ICD-10-CM | POA: Diagnosis not present

## 2023-08-12 DIAGNOSIS — D72829 Elevated white blood cell count, unspecified: Secondary | ICD-10-CM | POA: Diagnosis not present

## 2023-08-12 LAB — CBC WITH DIFFERENTIAL/PLATELET
Basophils Absolute: 0.1 10*3/uL (ref 0.0–0.1)
Basophils Relative: 0.5 % (ref 0.0–3.0)
Eosinophils Absolute: 0.5 10*3/uL (ref 0.0–0.7)
Eosinophils Relative: 4.7 % (ref 0.0–5.0)
HCT: 44.6 % (ref 36.0–46.0)
Hemoglobin: 14.9 g/dL (ref 12.0–15.0)
Lymphocytes Relative: 45 % (ref 12.0–46.0)
Lymphs Abs: 4.9 10*3/uL — ABNORMAL HIGH (ref 0.7–4.0)
MCHC: 33.3 g/dL (ref 30.0–36.0)
MCV: 90.1 fL (ref 78.0–100.0)
Monocytes Absolute: 0.6 10*3/uL (ref 0.1–1.0)
Monocytes Relative: 5.9 % (ref 3.0–12.0)
Neutro Abs: 4.8 10*3/uL (ref 1.4–7.7)
Neutrophils Relative %: 43.9 % (ref 43.0–77.0)
Platelets: 339 10*3/uL (ref 150.0–400.0)
RBC: 4.95 Mil/uL (ref 3.87–5.11)
RDW: 13.7 % (ref 11.5–15.5)
WBC: 10.9 10*3/uL — ABNORMAL HIGH (ref 4.0–10.5)

## 2023-08-12 LAB — LIPID PANEL
Cholesterol: 227 mg/dL — ABNORMAL HIGH (ref 0–200)
HDL: 47.5 mg/dL (ref >39.00)
LDL Cholesterol: 146 mg/dL — ABNORMAL HIGH (ref 0–99)
NonHDL: 179.39
Total CHOL/HDL Ratio: 5
Triglycerides: 168 mg/dL — ABNORMAL HIGH (ref 0.0–149.0)
VLDL: 33.6 mg/dL (ref 0.0–40.0)

## 2023-08-12 LAB — HEMOGLOBIN A1C: Hgb A1c MFr Bld: 5.8 % (ref 4.6–6.5)

## 2023-09-02 ENCOUNTER — Telehealth: Payer: Self-pay | Admitting: Family Medicine

## 2023-09-02 DIAGNOSIS — E782 Mixed hyperlipidemia: Secondary | ICD-10-CM

## 2023-09-02 DIAGNOSIS — D72820 Lymphocytosis (symptomatic): Secondary | ICD-10-CM

## 2023-09-02 NOTE — Telephone Encounter (Signed)
Please give the patient a call to review lab results

## 2023-09-02 NOTE — Telephone Encounter (Signed)
Please advise of lab results

## 2023-09-03 NOTE — Telephone Encounter (Signed)
Hi Courtney Kelley, sorry for the confusion, my fault. I mistakenly took the zetia off of your med list last visit in Oct. Whatever dietary changes you have made have worked well! Your numbers (Total cholesterol, triglycerides, LDL) have all dropped a significant amount in just 1 month. I would say just continue with your current diet, no further cholesterol meds. Hope this clears things up. See you in April and we'll repeat labs. --PM

## 2023-10-09 ENCOUNTER — Other Ambulatory Visit: Payer: Self-pay | Admitting: Family Medicine

## 2023-10-09 NOTE — Telephone Encounter (Signed)
Copied from CRM [id number]. Topic: Clinical - Medication Refill >> Oct 09, 2023  9:44 AM Steele Sizer wrote: Most Recent Primary Care Visit:  Provider: [facility]  Department: [facility]  Visit Type: LAB  Date: 08/12/2023  Medication: Hydrocodone 10 MG   Has the patient contacted their pharmacy? Yes (Agent: If no, request that the patient contact the pharmacy for the refill. If patient does not wish to contact the pharmacy document the reason why and proceed with request.) (Agent: If yes, when and what did the pharmacy advise?) Pharmacy stated a new prescription has to be written   Is this the correct pharmacy for this prescription? Yes If no, delete pharmacy and type the correct one.  This is the patient's preferred pharmacy:  [id number], [place] - [address] [place] [address] Phone: [phone] Fax: [phone]   Has the prescription been filled recently? Yes  Is the patient out of the medication? Yes  Has the patient been seen for an appointment in the last year OR does the patient have an upcoming appointment?   Can we respond through MyChart?   Agent: Please be advised that Rx refills may take up to 3 business days. We ask that you follow-up with your pharmacy.

## 2023-12-23 DIAGNOSIS — L821 Other seborrheic keratosis: Secondary | ICD-10-CM | POA: Diagnosis not present

## 2023-12-23 DIAGNOSIS — L409 Psoriasis, unspecified: Secondary | ICD-10-CM | POA: Diagnosis not present

## 2023-12-23 DIAGNOSIS — L814 Other melanin hyperpigmentation: Secondary | ICD-10-CM | POA: Diagnosis not present

## 2023-12-23 DIAGNOSIS — D1801 Hemangioma of skin and subcutaneous tissue: Secondary | ICD-10-CM | POA: Diagnosis not present

## 2023-12-30 DIAGNOSIS — N3289 Other specified disorders of bladder: Secondary | ICD-10-CM | POA: Diagnosis not present

## 2023-12-30 DIAGNOSIS — R319 Hematuria, unspecified: Secondary | ICD-10-CM | POA: Diagnosis not present

## 2024-01-01 ENCOUNTER — Ambulatory Visit: Payer: Self-pay

## 2024-01-01 NOTE — Telephone Encounter (Signed)
  Chief Complaint: Ear congestion Symptoms: right ear congestion Frequency: constant X 7 days Pertinent Negatives: Patient denies fever, new symptoms Disposition: [] ED /[] Urgent Care (no appt availability in office) / [x] Appointment(In office/virtual)/ []  [facility]/ [] Home Care/ [] Refused Recommended Disposition /[] [place]/ []  Follow-up with PCP Additional Notes:  Right ear has been "clogged" for 7 days, she called her ENT but they are unable to see her until June 23rd. She as been scheduled an acute visit with Dr. Johnette Naegeli on 01/07/24. She is having difficulty hearing out of her right ear, denies pain. She notes she also has psoriasis to her right ear for which she uses rx drops PRN when itchy. Due to drops causing vertigo she applies with a q-tip. Educated to avoid q-tip in ear canal, to make sure oil is close to body temperature to help avoid vertigo. She has tried multiple home treatments to help resolve her ear congestion but all have been minimally to ineffective. Today she took oral decongestant. Using Nasacort daily. Irrigated with saline. Moist and dry heat not helping. Tried icing. She has swallowed while pinching nose. Used 2 drops of peroxide last evening with moist heat and that seemed to break up "clog" but only while moist heat was applied.  Denies pain unless she tugs on her ear. She has been walking outside a lot, wondering if pollen could be causing increased ear congestion, she also notes she had a recent dental appointment where they used a water pick instead of the usual scraping, she wonders if the water pick cleaning could have caused increased inflammation. Denies fever, swelling, redness. She has an acute visit scheduled with Dr. Johnette Naegeli on 01/07/24. Educated on care advice as documented in protocol, patient verbalized understanding. Discussed reasons to call back.     Copied from CRM [id number]. Topic: Clinical - Medical Advice >> Jan 01, 2024  2:45 PM  Luane Rumps D wrote: Reason for CRM: Patient experiencing a clogged right ear, no other symptoms or pain just hard to hear. Patient stated that she used 2 drops of peroxide in her ear and hot compress but it didn't help. She is wondering if there is anything she can do in the meantime as she is scheduled for 04/23 regarding this issue. She is also taking Fluocinolone Acetonide 0.01 % OIL for psoriasis and is wondering if this is contributing to her plugged ear. Reason for Disposition  Ear congestion present > 48 hours  Protocols used: Ear - Congestion-A-AH

## 2024-01-05 DIAGNOSIS — R3129 Other microscopic hematuria: Secondary | ICD-10-CM | POA: Diagnosis not present

## 2024-01-05 DIAGNOSIS — R310 Gross hematuria: Secondary | ICD-10-CM | POA: Diagnosis not present

## 2024-01-05 DIAGNOSIS — N3289 Other specified disorders of bladder: Secondary | ICD-10-CM | POA: Diagnosis not present

## 2024-01-06 DIAGNOSIS — R3129 Other microscopic hematuria: Secondary | ICD-10-CM | POA: Diagnosis not present

## 2024-01-06 DIAGNOSIS — R8289 Other abnormal findings on cytological and histological examination of urine: Secondary | ICD-10-CM | POA: Diagnosis not present

## 2024-01-07 ENCOUNTER — Ambulatory Visit (INDEPENDENT_AMBULATORY_CARE_PROVIDER_SITE_OTHER): Admitting: Family Medicine

## 2024-01-07 ENCOUNTER — Telehealth: Payer: Self-pay

## 2024-01-07 ENCOUNTER — Encounter: Payer: Self-pay | Admitting: Family Medicine

## 2024-01-07 VITALS — BP 128/77 | HR 89 | Ht 66.0 in | Wt 191.8 lb

## 2024-01-07 DIAGNOSIS — R7303 Prediabetes: Secondary | ICD-10-CM

## 2024-01-07 DIAGNOSIS — H9201 Otalgia, right ear: Secondary | ICD-10-CM

## 2024-01-07 DIAGNOSIS — E7800 Pure hypercholesterolemia, unspecified: Secondary | ICD-10-CM

## 2024-01-07 DIAGNOSIS — E039 Hypothyroidism, unspecified: Secondary | ICD-10-CM | POA: Diagnosis not present

## 2024-01-07 DIAGNOSIS — L409 Psoriasis, unspecified: Secondary | ICD-10-CM

## 2024-01-07 DIAGNOSIS — H60391 Other infective otitis externa, right ear: Secondary | ICD-10-CM | POA: Diagnosis not present

## 2024-01-07 MED ORDER — NEOMYCIN-POLYMYXIN-HC 3.5-10000-1 OT SOLN: 3.0000 [drp] | Freq: Three times a day (TID) | OTIC | 0 refills | Status: AC

## 2024-01-07 MED ORDER — CLINDAMYCIN HCL 150 MG PO CAPS: 150.0000 mg | ORAL_CAPSULE | Freq: Three times a day (TID) | ORAL | 0 refills | Status: AC

## 2024-01-07 MED ORDER — CEPHALEXIN 250 MG PO CAPS: 250.0000 mg | ORAL_CAPSULE | Freq: Two times a day (BID) | ORAL | 0 refills | Status: DC

## 2024-01-07 NOTE — Telephone Encounter (Signed)
 Pt advised rx sent and sig directions discussed.

## 2024-01-07 NOTE — Progress Notes (Addendum)
 OFFICE VISIT  01/07/2024  CC:  Chief Complaint  Patient presents with   Medical Management of Chronic Issues    Pt is not fasting   Ear Concern    Right ear, hard to ear x 3 weeks; possibly clogged, tender to touch and sore    Patient is a 73 y.o. female who presents for 66-month follow-up hypothyroidism and hypercholesterolemia.  Also has concern of right ear clogged. A/P as of last visit: "#1 acquired hypothyroidism. TSH monitoring today. Continue Armour Thyroid  60 mg tab/day and 2 of the 15 mg tabs a day.   #2 hypercholesterolemia. She has not been taking zetia . Lipid panel today.   #3 overuse injury-> mild biceps muscle strain. Mild right arm lateral epicondylitis. Relative rest recommended.   #4 vitamin D  deficiency. She takes 5000 U daily. Vitamin D  level checked today."  INTERIM HX: Approximately 2 weeks of progressively worsening right ear fullness, decreased hearing, and pain. She has psoriasis, says it commonly gets in the right ear canal.  Ear canal has been itching a lot and feels swollen.  No ear drainage. No fever, no sinus congestion.   LDL was elevated last visit.  She preferred to hold off on statin. Hemoglobin A1c was 5.8%.  PMP AWARE reviewed today: most recent rx for alprazolam  was filled 12/16/23, # 180, rx by me. No red flags.  ROS as above, plus--> no fevers, no CP, no SOB, no wheezing, no cough, no dizziness, no HAs, no rashes, no melena/hematochezia.  No polyuria or polydipsia.  No myalgias or arthralgias.  No focal weakness, paresthesias, or tremors.  No acute vision or hearing abnormalities.  No dysuria or unusual/new urinary urgency or frequency.  No recent changes in lower legs. No n/v/d or abd pain.  No palpitations.    Past Medical History:  Diagnosis Date   Anxiety    Asthma    Hay fever    Hypercholesterolemia    Hypothyroidism    LBBB (left bundle branch block)    Osteoarthritis    Psoriasis     Past Surgical History:  Procedure  Laterality Date   CARDIAC CATHETERIZATION  2022   nonosbst cad   DOBUTAMINE  STRESS ECHO  2023   +WMA->?ischemia? EF 55%-->f/u cath clean   LEG / ANKLE SOFT TISSUE BIOPSY  12/2021    Outpatient Medications Prior to Visit  Medication Sig Dispense Refill   ALPRAZolam  (XANAX ) 0.5 MG tablet Take 1 tablet (0.5 mg total) by mouth 2 (two) times daily as needed. 180 tablet 1   amphetamine-dextroamphetamine (ADDERALL) 10 MG tablet Take 10 mg by mouth daily with breakfast.     APPLE CIDER VINEGAR PO Take by mouth daily.     ARMOUR THYROID  60 MG tablet Take 1 tablet (60 mg total) by mouth 2 (two) times daily. 180 tablet 1   ascorbic acid (VITAMIN C) 250 MG tablet Take 500 mg by mouth daily.     calcipotriene (DOVONOX) 0.005 % cream Apply topically 2 (two) times daily.     Cholecalciferol 125 MCG (5000 UT) capsule Take 5,000 Units by mouth daily.     clobetasol cream (TEMOVATE) 0.05 % Apply 1 Application topically 2 (two) times daily.     co-enzyme Q-10 30 MG capsule Take 30 mg by mouth 3 (three) times daily.     Flaxseed, Linseed, (FLAX SEED OIL PO) Take by mouth daily.     Magnesium 500 MG TABS Take 1 capsule by mouth daily.     Multiple Vitamin (ONE-DAILY  MULTI-VITAMIN) PACK Take by mouth daily.     OVER THE COUNTER MEDICATION Super Beets     Probiotic Product (PROBIOTIC PO) Take by mouth daily.     Resveratrol 100 MG CAPS Take 1 capsule by mouth daily.     thyroid  (ARMOUR) 15 MG tablet 1 po bid 180 tablet 1   Fluocinolone Acetonide 0.01 % OIL PLACE 1 APPLICATION IN EAR(S) DAILY AS NEEDED. (Patient not taking: Reported on 01/07/2024)     triamcinolone 0.1%-silver sulfadiazine 1:1 cream mixture Apply 1 application  topically 2 (two) times daily as needed. (Patient not taking: Reported on 01/07/2024)     No facility-administered medications prior to visit.    Allergies  Allergen Reactions   Aspirin     REACTION: GI intol   Penicillins     Review of Systems As per HPI  PE:    01/07/2024    10:18 AM 07/14/2023    9:53 AM 03/12/2023    1:06 PM  Vitals with BMI  Height 5\' 6"  5\' 6"    Weight 191 lbs 13 oz 198 lbs 10 oz 205 lbs  BMI 30.97 32.07   Systolic 128 138 528  Diastolic 77 77 77  Pulse 89 74 69     Physical Exam  Gen: Alert, well appearing.  Patient is oriented to person, place, time, and situation. AFFECT: Anxious but pleasant, lucid thought and speech. Left ear canal without rash, swelling, erythema, or exudate.  Small amount of cerumen present.  Tympanic membrane normal.  Right tragus region and extending into external auditory canal there is pinkish superficial flaking of the skin, some swelling of the canal, and a prominent mount of cerumen in the distal canal.  Tympanic membrane is visible and is intact and without erythema or bulging. I do not see any pus in the canal.  LABS:  Last CBC Lab Results  Component Value Date   WBC 10.9 (H) 08/12/2023   HGB 14.9 08/12/2023   HCT 44.6 08/12/2023   MCV 90.1 08/12/2023   RDW 13.7 08/12/2023   PLT 339.0 08/12/2023   Last metabolic panel Lab Results  Component Value Date   GLUCOSE 106 (H) 07/14/2023   NA 139 07/14/2023   K 4.6 07/14/2023   CL 101 07/14/2023   CO2 28 07/14/2023   BUN 16 07/14/2023   CREATININE 0.65 07/14/2023   GFR 89.17 07/14/2023   CALCIUM  9.7 07/14/2023   PROT 7.4 07/14/2023   ALBUMIN 4.5 07/14/2023   BILITOT 0.5 07/14/2023   ALKPHOS 105 07/14/2023   AST 19 07/14/2023   ALT 17 07/14/2023   Last lipids Lab Results  Component Value Date   CHOL 227 (H) 08/12/2023   HDL 47.50 08/12/2023   LDLCALC 146 (H) 08/12/2023   TRIG 168.0 (H) 08/12/2023   CHOLHDL 5 08/12/2023   Last hemoglobin A1c Lab Results  Component Value Date   HGBA1C 5.8 08/12/2023   Last thyroid  functions Lab Results  Component Value Date   TSH 4.09 07/14/2023   Last vitamin D  Lab Results  Component Value Date   VD25OH 43.26 07/14/2023   IMPRESSION AND PLAN:  #1 right ear external auditory canal  inflamed/psoriatic rash, with possible bacterial superinfection. She will stop her fluocinolone acetonide oil drops for now. Cortisporin otic drops, 3 drops in canal 3 times a day for 3 days. Keflex  250 mg twice daily x 7 days (patient requested/prefers low-dose due to potential GI upset when she takes antibiotics).  #2 acquired hypothyroidism. TSH monitoring today. Continue  Armour Thyroid  60 mg tab bid and 2 of the 15 mg tabs a day.   #3 hypercholesterolemia. She has not been on Zetia  in over a year. She has declined statin in the past. Lipid panel today.  #4  Gross hematuria (+suprapubic pressure, urgency, frequency).  11/2023--> no records available. Per patient report, initially presented to her gynecologist who did a pelvic ultrasound and it showed question of a bladder mass. She then was referred to urology and a CT scan and cystoscopy are planned as of 01/07/2024.  No record of urine culture being done.  She was never prescribed antibiotics to her recollection. Interestingly, she says she has not had any blood in urine or urinary urgency or pressure or frequency since Easter Sunday (4 days ago).  All of the labs ordered for today will be done when she returns for a fasting lab visit next week.  An After Visit Summary was printed and given to the patient.  FOLLOW UP: Return in about 6 months (around 07/08/2024) for annual CPE (fasting).  Signed:  Arletha Lady, MD           01/07/2024

## 2024-01-07 NOTE — Telephone Encounter (Signed)
 Clindamycin  rx'd

## 2024-01-07 NOTE — Telephone Encounter (Signed)
 Pt called stating that after reading the ingredients on the abx she realized that she can not take it due to her allergy to penicillin. Can she have a different abx sent to the pharmacy.

## 2024-01-07 NOTE — Addendum Note (Signed)
 Addended by: Shelvia Dick on: 01/07/2024 04:33 PM   Modules accepted: Orders

## 2024-01-08 ENCOUNTER — Other Ambulatory Visit: Payer: Self-pay

## 2024-01-08 ENCOUNTER — Encounter: Payer: Self-pay | Admitting: Family Medicine

## 2024-01-12 ENCOUNTER — Ambulatory Visit: Payer: Medicare Other | Admitting: Family Medicine

## 2024-01-12 ENCOUNTER — Other Ambulatory Visit (INDEPENDENT_AMBULATORY_CARE_PROVIDER_SITE_OTHER)

## 2024-01-12 DIAGNOSIS — E782 Mixed hyperlipidemia: Secondary | ICD-10-CM | POA: Diagnosis not present

## 2024-01-12 DIAGNOSIS — E039 Hypothyroidism, unspecified: Secondary | ICD-10-CM

## 2024-01-12 DIAGNOSIS — E7800 Pure hypercholesterolemia, unspecified: Secondary | ICD-10-CM | POA: Diagnosis not present

## 2024-01-12 DIAGNOSIS — R7303 Prediabetes: Secondary | ICD-10-CM

## 2024-01-12 DIAGNOSIS — D72820 Lymphocytosis (symptomatic): Secondary | ICD-10-CM | POA: Diagnosis not present

## 2024-01-12 LAB — CBC WITH DIFFERENTIAL/PLATELET
Basophils Absolute: 0.1 10*3/uL (ref 0.0–0.1)
Basophils Relative: 0.8 % (ref 0.0–3.0)
Eosinophils Absolute: 0.4 10*3/uL (ref 0.0–0.7)
Eosinophils Relative: 2.5 % (ref 0.0–5.0)
HCT: 47 % — ABNORMAL HIGH (ref 36.0–46.0)
Hemoglobin: 15.7 g/dL — ABNORMAL HIGH (ref 12.0–15.0)
Lymphocytes Relative: 36.2 % (ref 12.0–46.0)
Lymphs Abs: 5.1 10*3/uL — ABNORMAL HIGH (ref 0.7–4.0)
MCHC: 33.4 g/dL (ref 30.0–36.0)
MCV: 89.1 fl (ref 78.0–100.0)
Monocytes Absolute: 0.7 10*3/uL (ref 0.1–1.0)
Monocytes Relative: 5 % (ref 3.0–12.0)
Neutro Abs: 7.8 10*3/uL — ABNORMAL HIGH (ref 1.4–7.7)
Neutrophils Relative %: 55.5 % (ref 43.0–77.0)
Platelets: 372 10*3/uL (ref 150.0–400.0)
RBC: 5.27 Mil/uL — ABNORMAL HIGH (ref 3.87–5.11)
RDW: 13.6 % (ref 11.5–15.5)
WBC: 14 10*3/uL — ABNORMAL HIGH (ref 4.0–10.5)

## 2024-01-12 LAB — LIPID PANEL
Cholesterol: 246 mg/dL — ABNORMAL HIGH (ref 0–200)
HDL: 44 mg/dL (ref >39.00)
LDL Cholesterol: 142 mg/dL — ABNORMAL HIGH (ref 0–99)
NonHDL: 202.47
Total CHOL/HDL Ratio: 6
Triglycerides: 301 mg/dL — ABNORMAL HIGH (ref 0.0–149.0)
VLDL: 60.2 mg/dL — ABNORMAL HIGH (ref 0.0–40.0)

## 2024-01-12 LAB — TSH: TSH: 4.53 u[IU]/mL (ref 0.35–5.50)

## 2024-01-12 LAB — ALT: ALT: 15 U/L (ref 0–35)

## 2024-01-12 LAB — HEMOGLOBIN A1C: Hgb A1c MFr Bld: 6.2 % (ref 4.6–6.5)

## 2024-01-12 LAB — AST: AST: 15 U/L (ref 0–37)

## 2024-01-13 ENCOUNTER — Telehealth: Payer: Self-pay

## 2024-01-13 ENCOUNTER — Encounter: Payer: Self-pay | Admitting: Family Medicine

## 2024-01-13 DIAGNOSIS — K8020 Calculus of gallbladder without cholecystitis without obstruction: Secondary | ICD-10-CM | POA: Diagnosis not present

## 2024-01-13 DIAGNOSIS — K5730 Diverticulosis of large intestine without perforation or abscess without bleeding: Secondary | ICD-10-CM | POA: Diagnosis not present

## 2024-01-13 DIAGNOSIS — D751 Secondary polycythemia: Secondary | ICD-10-CM

## 2024-01-13 DIAGNOSIS — D72829 Elevated white blood cell count, unspecified: Secondary | ICD-10-CM

## 2024-01-13 DIAGNOSIS — N3289 Other specified disorders of bladder: Secondary | ICD-10-CM | POA: Diagnosis not present

## 2024-01-13 DIAGNOSIS — R319 Hematuria, unspecified: Secondary | ICD-10-CM | POA: Diagnosis not present

## 2024-01-13 MED ORDER — ATORVASTATIN CALCIUM 20 MG PO TABS: 20.0000 mg | ORAL_TABLET | Freq: Every day | ORAL | 1 refills | Status: AC

## 2024-01-13 NOTE — Telephone Encounter (Signed)
-----   Message from Shelvia Dick sent at 01/13/2024 10:17 AM EDT ----- I sent patient a MyChart message about these results.  Please order hematology referral, Dr. Maria Shiner. Diagnosis leukocytosis and erythrocytosis.  Also, send in prescription for atorvastatin 20 mg, 1 tab p.o. daily, #30, refill x 1.

## 2024-01-14 ENCOUNTER — Telehealth: Payer: Self-pay

## 2024-01-14 NOTE — Telephone Encounter (Signed)
 Copied from CRM [id number]. Topic: General - Other >> Jan 14, 2024  2:26 PM Howard Macho wrote: Reason for CRM: patient would only like the doctor to call her back regarding questions about her care and treatment  [phone]

## 2024-01-14 NOTE — Telephone Encounter (Signed)
 No these results would not be affected by that.

## 2024-01-14 NOTE — Telephone Encounter (Signed)
 Spoke with patient regarding WBC elevation and need for hematology referral. She was concerned due to referral stating [facility], advised they are in the same office/building but this does not mean she has cancer. She stated she would need to think this over due to other health issues she is trying to resolve first. Advised it is not recommended she wait 3 months for repeat CBC due to past elevations. Provider has been made aware of discussion with patient, recommendations remain the same.

## 2024-01-16 ENCOUNTER — Other Ambulatory Visit: Payer: Self-pay | Admitting: Family Medicine

## 2024-01-23 ENCOUNTER — Other Ambulatory Visit: Payer: Self-pay | Admitting: Family Medicine

## 2024-01-23 ENCOUNTER — Other Ambulatory Visit: Payer: Self-pay

## 2024-01-23 DIAGNOSIS — D72829 Elevated white blood cell count, unspecified: Secondary | ICD-10-CM

## 2024-01-23 DIAGNOSIS — D751 Secondary polycythemia: Secondary | ICD-10-CM

## 2024-01-23 MED ORDER — THYROID 15 MG PO TABS: ORAL_TABLET | ORAL | 1 refills | Status: AC

## 2024-01-23 MED ORDER — ARMOUR THYROID 60 MG PO TABS: 60.0000 mg | ORAL_TABLET | Freq: Two times a day (BID) | ORAL | 1 refills | Status: DC

## 2024-01-23 MED ORDER — THYROID 60 MG PO TABS: 60.0000 mg | ORAL_TABLET | Freq: Two times a day (BID) | ORAL | 1 refills | Status: AC

## 2024-01-23 NOTE — Addendum Note (Signed)
 Addended by: Terris Fickle D on: 01/23/2024 10:45 AM   Modules accepted: Orders

## 2024-01-23 NOTE — Telephone Encounter (Signed)
 Pt is requesting generic version.   Please fill, if appropriate.

## 2024-02-26 DIAGNOSIS — R3129 Other microscopic hematuria: Secondary | ICD-10-CM | POA: Diagnosis not present

## 2024-03-02 ENCOUNTER — Encounter: Payer: Self-pay | Admitting: Family Medicine

## 2024-03-02 DIAGNOSIS — D751 Secondary polycythemia: Secondary | ICD-10-CM

## 2024-03-02 DIAGNOSIS — R7303 Prediabetes: Secondary | ICD-10-CM

## 2024-03-02 DIAGNOSIS — E7800 Pure hypercholesterolemia, unspecified: Secondary | ICD-10-CM

## 2024-03-02 DIAGNOSIS — E039 Hypothyroidism, unspecified: Secondary | ICD-10-CM

## 2024-03-03 NOTE — Telephone Encounter (Signed)
 FYI reviewed. No further action needed

## 2024-03-03 NOTE — Telephone Encounter (Signed)
 No further action needed.

## 2024-03-04 ENCOUNTER — Other Ambulatory Visit

## 2024-03-08 DIAGNOSIS — C672 Malignant neoplasm of lateral wall of bladder: Secondary | ICD-10-CM | POA: Diagnosis not present

## 2024-03-08 DIAGNOSIS — C678 Malignant neoplasm of overlapping sites of bladder: Secondary | ICD-10-CM | POA: Diagnosis not present

## 2024-03-08 DIAGNOSIS — C674 Malignant neoplasm of posterior wall of bladder: Secondary | ICD-10-CM | POA: Diagnosis not present

## 2024-03-08 DIAGNOSIS — I10 Essential (primary) hypertension: Secondary | ICD-10-CM | POA: Diagnosis not present

## 2024-03-08 DIAGNOSIS — E039 Hypothyroidism, unspecified: Secondary | ICD-10-CM | POA: Diagnosis not present

## 2024-03-08 DIAGNOSIS — I2510 Atherosclerotic heart disease of native coronary artery without angina pectoris: Secondary | ICD-10-CM | POA: Diagnosis not present

## 2024-03-08 DIAGNOSIS — Z79899 Other long term (current) drug therapy: Secondary | ICD-10-CM | POA: Diagnosis not present

## 2024-03-08 DIAGNOSIS — D494 Neoplasm of unspecified behavior of bladder: Secondary | ICD-10-CM | POA: Diagnosis not present

## 2024-03-08 DIAGNOSIS — Z880 Allergy status to penicillin: Secondary | ICD-10-CM | POA: Diagnosis not present

## 2024-03-08 DIAGNOSIS — Z886 Allergy status to analgesic agent status: Secondary | ICD-10-CM | POA: Diagnosis not present

## 2024-03-08 DIAGNOSIS — Z87891 Personal history of nicotine dependence: Secondary | ICD-10-CM | POA: Diagnosis not present

## 2024-03-09 ENCOUNTER — Other Ambulatory Visit

## 2024-03-09 DIAGNOSIS — I2510 Atherosclerotic heart disease of native coronary artery without angina pectoris: Secondary | ICD-10-CM | POA: Diagnosis not present

## 2024-03-09 DIAGNOSIS — E039 Hypothyroidism, unspecified: Secondary | ICD-10-CM | POA: Diagnosis not present

## 2024-03-09 DIAGNOSIS — Z79899 Other long term (current) drug therapy: Secondary | ICD-10-CM | POA: Diagnosis not present

## 2024-03-09 DIAGNOSIS — Z87891 Personal history of nicotine dependence: Secondary | ICD-10-CM | POA: Diagnosis not present

## 2024-03-09 DIAGNOSIS — C674 Malignant neoplasm of posterior wall of bladder: Secondary | ICD-10-CM | POA: Diagnosis not present

## 2024-03-09 DIAGNOSIS — C672 Malignant neoplasm of lateral wall of bladder: Secondary | ICD-10-CM | POA: Diagnosis not present

## 2024-03-09 DIAGNOSIS — Z880 Allergy status to penicillin: Secondary | ICD-10-CM | POA: Diagnosis not present

## 2024-03-09 DIAGNOSIS — I10 Essential (primary) hypertension: Secondary | ICD-10-CM | POA: Diagnosis not present

## 2024-03-09 DIAGNOSIS — Z886 Allergy status to analgesic agent status: Secondary | ICD-10-CM | POA: Diagnosis not present

## 2024-03-09 DIAGNOSIS — D414 Neoplasm of uncertain behavior of bladder: Secondary | ICD-10-CM | POA: Diagnosis not present

## 2024-03-09 LAB — BASIC METABOLIC PANEL WITH GFR
BUN: 10 (ref 4–21)
CO2: 26 — AB (ref 13–22)
Chloride: 104 (ref 99–108)
Glucose: 115
Potassium: 3.9 meq/L (ref 3.5–5.1)
Sodium: 137 (ref 137–147)

## 2024-03-09 LAB — CBC AND DIFFERENTIAL
Hemoglobin: 12.4 (ref 12.0–16.0)
Platelets: 281 10*3/uL (ref 150–400)
WBC: 16.6

## 2024-03-18 DIAGNOSIS — C679 Malignant neoplasm of bladder, unspecified: Secondary | ICD-10-CM | POA: Diagnosis not present

## 2024-03-20 ENCOUNTER — Encounter: Admitting: Nurse Practitioner

## 2024-03-20 NOTE — Progress Notes (Signed)
 VV is for Mr. Jurich not Mrs. Saggese.

## 2024-03-24 ENCOUNTER — Encounter: Payer: Self-pay | Admitting: Family Medicine

## 2024-03-24 NOTE — Telephone Encounter (Signed)
 Yes ok for lab appt.

## 2024-04-06 ENCOUNTER — Other Ambulatory Visit (INDEPENDENT_AMBULATORY_CARE_PROVIDER_SITE_OTHER)

## 2024-04-06 DIAGNOSIS — D72829 Elevated white blood cell count, unspecified: Secondary | ICD-10-CM | POA: Diagnosis not present

## 2024-04-06 DIAGNOSIS — E039 Hypothyroidism, unspecified: Secondary | ICD-10-CM

## 2024-04-06 DIAGNOSIS — D751 Secondary polycythemia: Secondary | ICD-10-CM

## 2024-04-06 DIAGNOSIS — R7303 Prediabetes: Secondary | ICD-10-CM | POA: Diagnosis not present

## 2024-04-06 DIAGNOSIS — E7800 Pure hypercholesterolemia, unspecified: Secondary | ICD-10-CM

## 2024-04-06 LAB — CBC WITH DIFFERENTIAL/PLATELET
Basophils Absolute: 0.1 K/uL (ref 0.0–0.1)
Basophils Relative: 1.1 % (ref 0.0–3.0)
Eosinophils Absolute: 0.2 K/uL (ref 0.0–0.7)
Eosinophils Relative: 1.8 % (ref 0.0–5.0)
HCT: 40.7 % (ref 36.0–46.0)
Hemoglobin: 13.5 g/dL (ref 12.0–15.0)
Lymphocytes Relative: 42.8 % (ref 12.0–46.0)
Lymphs Abs: 4.3 K/uL — ABNORMAL HIGH (ref 0.7–4.0)
MCHC: 33.2 g/dL (ref 30.0–36.0)
MCV: 91.4 fl (ref 78.0–100.0)
Monocytes Absolute: 0.4 K/uL (ref 0.1–1.0)
Monocytes Relative: 4.4 % (ref 3.0–12.0)
Neutro Abs: 5 K/uL (ref 1.4–7.7)
Neutrophils Relative %: 49.9 % (ref 43.0–77.0)
Platelets: 448 K/uL — ABNORMAL HIGH (ref 150.0–400.0)
RBC: 4.45 Mil/uL (ref 3.87–5.11)
RDW: 14.6 % (ref 11.5–15.5)
WBC: 10.1 K/uL (ref 4.0–10.5)

## 2024-04-06 LAB — LIPID PANEL
Cholesterol: 257 mg/dL — ABNORMAL HIGH (ref 0–200)
HDL: 52.9 mg/dL (ref >39.00)
LDL Cholesterol: 170 mg/dL — ABNORMAL HIGH (ref 0–99)
NonHDL: 203.81
Total CHOL/HDL Ratio: 5
Triglycerides: 167 mg/dL — ABNORMAL HIGH (ref 0.0–149.0)
VLDL: 33.4 mg/dL (ref 0.0–40.0)

## 2024-04-06 LAB — HEMOGLOBIN A1C: Hgb A1c MFr Bld: 4.8 % (ref 4.6–6.5)

## 2024-04-06 LAB — IBC + FERRITIN
Ferritin: 146.8 ng/mL (ref 10.0–291.0)
Iron: 105 ug/dL (ref 42–145)
Saturation Ratios: 33.5 % (ref 20.0–50.0)
TIBC: 313.6 ug/dL (ref 250.0–450.0)
Transferrin: 224 mg/dL (ref 212.0–360.0)

## 2024-04-06 LAB — TSH: TSH: 3.77 u[IU]/mL (ref 0.35–5.50)

## 2024-04-07 ENCOUNTER — Ambulatory Visit: Payer: Self-pay | Admitting: Family Medicine

## 2024-04-08 DIAGNOSIS — N3289 Other specified disorders of bladder: Secondary | ICD-10-CM | POA: Diagnosis not present

## 2024-04-09 MED ORDER — EZETIMIBE 10 MG PO TABS: 5.0000 mg | ORAL_TABLET | Freq: Every day | ORAL | 2 refills | Status: AC

## 2024-04-09 NOTE — Telephone Encounter (Signed)
 Okay, do prescription for Zetia  10 mg, half tab per day, #15, refill x 2. We can hold off on hematology referral at this time.

## 2024-04-19 ENCOUNTER — Encounter: Admitting: Family Medicine

## 2024-05-06 DIAGNOSIS — N3289 Other specified disorders of bladder: Secondary | ICD-10-CM | POA: Diagnosis not present

## 2024-05-31 DIAGNOSIS — N3289 Other specified disorders of bladder: Secondary | ICD-10-CM | POA: Diagnosis not present

## 2024-06-02 ENCOUNTER — Encounter: Payer: Self-pay | Admitting: Family Medicine

## 2024-06-08 DIAGNOSIS — Z87891 Personal history of nicotine dependence: Secondary | ICD-10-CM | POA: Diagnosis not present

## 2024-06-08 DIAGNOSIS — Z7982 Long term (current) use of aspirin: Secondary | ICD-10-CM | POA: Diagnosis not present

## 2024-06-08 DIAGNOSIS — I447 Left bundle-branch block, unspecified: Secondary | ICD-10-CM | POA: Diagnosis not present

## 2024-06-08 DIAGNOSIS — C679 Malignant neoplasm of bladder, unspecified: Secondary | ICD-10-CM | POA: Diagnosis not present

## 2024-06-08 DIAGNOSIS — Z79899 Other long term (current) drug therapy: Secondary | ICD-10-CM | POA: Diagnosis not present

## 2024-06-08 DIAGNOSIS — Z8551 Personal history of malignant neoplasm of bladder: Secondary | ICD-10-CM | POA: Diagnosis not present

## 2024-06-08 DIAGNOSIS — E039 Hypothyroidism, unspecified: Secondary | ICD-10-CM | POA: Diagnosis not present

## 2024-06-08 DIAGNOSIS — C672 Malignant neoplasm of lateral wall of bladder: Secondary | ICD-10-CM | POA: Diagnosis not present

## 2024-06-08 DIAGNOSIS — N3010 Interstitial cystitis (chronic) without hematuria: Secondary | ICD-10-CM | POA: Diagnosis not present

## 2024-06-08 DIAGNOSIS — N329 Bladder disorder, unspecified: Secondary | ICD-10-CM | POA: Diagnosis not present

## 2024-06-08 DIAGNOSIS — I2510 Atherosclerotic heart disease of native coronary artery without angina pectoris: Secondary | ICD-10-CM | POA: Diagnosis not present

## 2024-06-08 DIAGNOSIS — N3289 Other specified disorders of bladder: Secondary | ICD-10-CM | POA: Diagnosis not present

## 2024-06-08 DIAGNOSIS — N3020 Other chronic cystitis without hematuria: Secondary | ICD-10-CM | POA: Diagnosis not present

## 2024-06-28 DIAGNOSIS — E559 Vitamin D deficiency, unspecified: Secondary | ICD-10-CM | POA: Diagnosis not present

## 2024-06-28 DIAGNOSIS — Z789 Other specified health status: Secondary | ICD-10-CM | POA: Diagnosis not present

## 2024-06-28 DIAGNOSIS — N3289 Other specified disorders of bladder: Secondary | ICD-10-CM | POA: Diagnosis not present

## 2024-06-28 DIAGNOSIS — E785 Hyperlipidemia, unspecified: Secondary | ICD-10-CM | POA: Diagnosis not present

## 2024-06-28 DIAGNOSIS — L409 Psoriasis, unspecified: Secondary | ICD-10-CM | POA: Diagnosis not present

## 2024-06-28 DIAGNOSIS — R7301 Impaired fasting glucose: Secondary | ICD-10-CM | POA: Diagnosis not present

## 2024-06-28 DIAGNOSIS — E039 Hypothyroidism, unspecified: Secondary | ICD-10-CM | POA: Diagnosis not present

## 2024-06-28 DIAGNOSIS — Z7689 Persons encountering health services in other specified circumstances: Secondary | ICD-10-CM | POA: Diagnosis not present

## 2024-06-28 DIAGNOSIS — G4700 Insomnia, unspecified: Secondary | ICD-10-CM | POA: Diagnosis not present

## 2024-06-28 DIAGNOSIS — I2510 Atherosclerotic heart disease of native coronary artery without angina pectoris: Secondary | ICD-10-CM | POA: Diagnosis not present

## 2024-07-02 DIAGNOSIS — C678 Malignant neoplasm of overlapping sites of bladder: Secondary | ICD-10-CM | POA: Diagnosis not present

## 2024-07-08 ENCOUNTER — Encounter: Admitting: Family Medicine
